# Patient Record
Sex: Female | Born: 1985 | Race: Black or African American | Hispanic: No | Marital: Single | Smoking: Current every day smoker
Health system: Southern US, Community
[De-identification: ages and names within clinical notes are randomized; demographics above are authoritative.]

## PROBLEM LIST (undated history)

## (undated) DIAGNOSIS — D649 Anemia, unspecified: Secondary | ICD-10-CM

## (undated) DIAGNOSIS — A6000 Herpesviral infection of urogenital system, unspecified: Secondary | ICD-10-CM

## (undated) DIAGNOSIS — J45909 Unspecified asthma, uncomplicated: Secondary | ICD-10-CM

## (undated) HISTORY — PX: TUBAL LIGATION: SHX77

## (undated) HISTORY — PX: CHOLECYSTECTOMY: SHX55

---
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
## 2005-01-01 ENCOUNTER — Inpatient Hospital Stay (HOSPITAL_COMMUNITY): Admission: AD | Admit: 2005-01-01 | Discharge: 2005-01-01 | Payer: Self-pay | Admitting: *Deleted

## 2005-04-17 ENCOUNTER — Emergency Department: Payer: Self-pay | Admitting: Emergency Medicine

## 2005-10-20 ENCOUNTER — Emergency Department: Payer: Self-pay | Admitting: Emergency Medicine

## 2006-06-20 ENCOUNTER — Emergency Department: Payer: Self-pay | Admitting: Emergency Medicine

## 2006-08-26 ENCOUNTER — Observation Stay: Payer: Self-pay | Admitting: Obstetrics and Gynecology

## 2006-09-02 ENCOUNTER — Ambulatory Visit: Payer: Self-pay | Admitting: Obstetrics and Gynecology

## 2007-12-28 ENCOUNTER — Emergency Department: Payer: Self-pay | Admitting: Emergency Medicine

## 2008-04-29 ENCOUNTER — Emergency Department: Payer: Self-pay | Admitting: Emergency Medicine

## 2008-05-16 ENCOUNTER — Emergency Department: Payer: Self-pay | Admitting: Emergency Medicine

## 2008-05-23 ENCOUNTER — Emergency Department: Payer: Self-pay | Admitting: Emergency Medicine

## 2008-08-01 ENCOUNTER — Emergency Department: Payer: Self-pay | Admitting: Emergency Medicine

## 2009-01-30 ENCOUNTER — Emergency Department: Payer: Self-pay | Admitting: Emergency Medicine

## 2009-09-28 ENCOUNTER — Emergency Department: Payer: Self-pay | Admitting: Emergency Medicine

## 2010-08-11 ENCOUNTER — Emergency Department: Payer: Self-pay | Admitting: Emergency Medicine

## 2011-08-01 ENCOUNTER — Emergency Department: Payer: Self-pay | Admitting: *Deleted

## 2011-08-06 ENCOUNTER — Observation Stay: Payer: Self-pay | Admitting: Surgery

## 2011-08-10 LAB — PATHOLOGY REPORT

## 2011-11-13 ENCOUNTER — Emergency Department: Payer: Self-pay | Admitting: Emergency Medicine

## 2011-11-14 LAB — COMPREHENSIVE METABOLIC PANEL
Albumin: 3.1 g/dL — ABNORMAL LOW (ref 3.4–5.0)
Anion Gap: 11 (ref 7–16)
Bilirubin,Total: 0.2 mg/dL (ref 0.2–1.0)
Chloride: 110 mmol/L — ABNORMAL HIGH (ref 98–107)
Co2: 25 mmol/L (ref 21–32)
Creatinine: 0.7 mg/dL (ref 0.60–1.30)
EGFR (African American): >60
EGFR (Non-African Amer.): >60
Glucose: 106 mg/dL — ABNORMAL HIGH (ref 65–99)
Osmolality: 293 (ref 275–301)
Potassium: 3.8 mmol/L (ref 3.5–5.1)
SGOT(AST): 19 U/L (ref 15–37)
SGPT (ALT): 22 U/L

## 2011-11-14 LAB — CBC
HGB: 8.7 g/dL — ABNORMAL LOW (ref 12.0–16.0)
MCH: 24.8 pg — ABNORMAL LOW (ref 26.0–34.0)
MCHC: 31.3 g/dL — ABNORMAL LOW (ref 32.0–36.0)
MCV: 79 fL — ABNORMAL LOW (ref 80–100)
RBC: 3.5 10*6/uL — ABNORMAL LOW (ref 3.80–5.20)

## 2011-11-14 LAB — LIPASE, BLOOD: Lipase: 99 U/L (ref 73–393)

## 2012-03-05 ENCOUNTER — Emergency Department: Payer: Self-pay | Admitting: *Deleted

## 2013-10-22 ENCOUNTER — Emergency Department: Payer: Self-pay | Admitting: Emergency Medicine

## 2013-10-22 LAB — URINALYSIS, COMPLETE
Bilirubin,UR: NEGATIVE
Blood: NEGATIVE
GLUCOSE, UR: NEGATIVE mg/dL (ref 0–75)
KETONE: NEGATIVE
NITRITE: NEGATIVE
Ph: 6 (ref 4.5–8.0)
Protein: NEGATIVE
RBC,UR: 10 /HPF (ref 0–5)
SPECIFIC GRAVITY: 1.029 (ref 1.003–1.030)
WBC UR: 46 /HPF (ref 0–5)

## 2013-10-22 LAB — WET PREP, GENITAL

## 2013-10-23 LAB — GC/CHLAMYDIA PROBE AMP

## 2013-10-25 LAB — URINE CULTURE

## 2014-08-31 ENCOUNTER — Emergency Department: Payer: Self-pay | Admitting: Internal Medicine

## 2015-01-07 ENCOUNTER — Emergency Department: Payer: Self-pay | Admitting: Emergency Medicine

## 2015-01-07 LAB — URINALYSIS, COMPLETE
Bilirubin,UR: NEGATIVE
Blood: NEGATIVE
GLUCOSE, UR: NEGATIVE mg/dL (ref 0–75)
Ketone: NEGATIVE
LEUKOCYTE ESTERASE: NEGATIVE
NITRITE: NEGATIVE
Ph: 5 (ref 4.5–8.0)
Protein: 30
RBC,UR: 4 /HPF (ref 0–5)
Specific Gravity: 1.031 (ref 1.003–1.030)

## 2015-01-07 LAB — CBC WITH DIFFERENTIAL/PLATELET
Basophil #: 0.1 10*3/uL (ref 0.0–0.1)
Basophil %: 0.6 %
EOS PCT: 2 %
Eosinophil #: 0.2 10*3/uL (ref 0.0–0.7)
HCT: 34.6 % — ABNORMAL LOW (ref 35.0–47.0)
HGB: 10.8 g/dL — ABNORMAL LOW (ref 12.0–16.0)
LYMPHS ABS: 1.3 10*3/uL (ref 1.0–3.6)
LYMPHS PCT: 14.9 %
MCH: 25.9 pg — ABNORMAL LOW (ref 26.0–34.0)
MCHC: 31.3 g/dL — ABNORMAL LOW (ref 32.0–36.0)
MCV: 83 fL (ref 80–100)
MONOS PCT: 8.6 %
Monocyte #: 0.7 x10 3/mm (ref 0.2–0.9)
NEUTROS PCT: 73.9 %
Neutrophil #: 6.4 10*3/uL (ref 1.4–6.5)
PLATELETS: 339 10*3/uL (ref 150–440)
RBC: 4.18 10*6/uL (ref 3.80–5.20)
RDW: 17.4 % — ABNORMAL HIGH (ref 11.5–14.5)
WBC: 8.7 10*3/uL (ref 3.6–11.0)

## 2015-01-07 LAB — COMPREHENSIVE METABOLIC PANEL
AST: 24 U/L
Albumin: 3.6 g/dL
Alkaline Phosphatase: 85 U/L
Anion Gap: 6 — ABNORMAL LOW (ref 7–16)
BUN: 9 mg/dL
Bilirubin,Total: 0.5 mg/dL
CALCIUM: 8.6 mg/dL — AB
CHLORIDE: 106 mmol/L
Co2: 27 mmol/L
Creatinine: 0.72 mg/dL
Glucose: 89 mg/dL
Potassium: 3.7 mmol/L
SGPT (ALT): 19 U/L
Sodium: 139 mmol/L
Total Protein: 6.9 g/dL

## 2015-02-11 ENCOUNTER — Emergency Department
Admission: EM | Admit: 2015-02-11 | Discharge: 2015-02-12 | Disposition: A | Discharge status: Home / Self Care | Payer: Self-pay | Attending: Emergency Medicine | Admitting: Emergency Medicine

## 2015-02-11 ENCOUNTER — Encounter: Payer: Self-pay | Admitting: *Deleted

## 2015-02-11 DIAGNOSIS — K2970 Gastritis, unspecified, without bleeding: Secondary | ICD-10-CM | POA: Insufficient documentation

## 2015-02-11 DIAGNOSIS — R1013 Epigastric pain: Secondary | ICD-10-CM

## 2015-02-11 DIAGNOSIS — Z720 Tobacco use: Secondary | ICD-10-CM | POA: Insufficient documentation

## 2015-02-11 HISTORY — DX: Anemia, unspecified: D64.9

## 2015-02-11 HISTORY — DX: Unspecified asthma, uncomplicated: J45.909

## 2015-02-11 HISTORY — DX: Herpesviral infection of urogenital system, unspecified: A60.00

## 2015-02-11 NOTE — ED Notes (Signed)
Pt was seen here last month for same type symptoms. Pt states she was told she has a virus but she does not agree with that. Pt states she continues to have symptoms at this time. Upper abd pain and spotting of vaginal blood.

## 2015-02-12 ENCOUNTER — Encounter: Payer: Self-pay | Admitting: Occupational Medicine

## 2015-02-12 LAB — CBC WITH DIFFERENTIAL/PLATELET
Basophils Absolute: 0 10*3/uL (ref 0–0.1)
Eosinophils Absolute: 0.2 10*3/uL (ref 0–0.7)
Eosinophils Relative: 2 %
HEMATOCRIT: 36 % (ref 35.0–47.0)
HEMOGLOBIN: 11.3 g/dL — AB (ref 12.0–16.0)
LYMPHS ABS: 1.5 10*3/uL (ref 1.0–3.6)
MCH: 26.4 pg (ref 26.0–34.0)
MCHC: 31.3 g/dL — ABNORMAL LOW (ref 32.0–36.0)
MCV: 84.3 fL (ref 80.0–100.0)
MONO ABS: 0.7 10*3/uL (ref 0.2–0.9)
Monocytes Relative: 8 %
NEUTROS ABS: 6.6 10*3/uL — AB (ref 1.4–6.5)
Neutrophils Relative %: 72 %
Platelets: 328 10*3/uL (ref 150–440)
RBC: 4.27 MIL/uL (ref 3.80–5.20)
RDW: 18.3 % — AB (ref 11.5–14.5)
WBC: 9 10*3/uL (ref 3.6–11.0)

## 2015-02-12 LAB — URINALYSIS COMPLETE WITH MICROSCOPIC (ARMC ONLY)
BACTERIA UA: NONE SEEN
Bilirubin Urine: NEGATIVE
Glucose, UA: NEGATIVE mg/dL
HGB URINE DIPSTICK: NEGATIVE
KETONES UR: NEGATIVE mg/dL
NITRITE: NEGATIVE
Protein, ur: NEGATIVE mg/dL
Specific Gravity, Urine: 1.004 — ABNORMAL LOW (ref 1.005–1.030)
pH: 6 (ref 5.0–8.0)

## 2015-02-12 LAB — BASIC METABOLIC PANEL
ANION GAP: 7 (ref 5–15)
BUN: 8 mg/dL (ref 6–20)
CALCIUM: 8.6 mg/dL — AB (ref 8.9–10.3)
CHLORIDE: 106 mmol/L (ref 101–111)
CO2: 25 mmol/L (ref 22–32)
Creatinine, Ser: 0.64 mg/dL (ref 0.44–1.00)
GFR calc Af Amer: >60 mL/min (ref >60)
GFR calc non Af Amer: >60 mL/min (ref >60)
GLUCOSE: 105 mg/dL — AB (ref 65–99)
POTASSIUM: 3.4 mmol/L — AB (ref 3.5–5.1)
Sodium: 138 mmol/L (ref 135–145)

## 2015-02-12 MED ORDER — TRAMADOL HCL 50 MG PO TABS: 50.0000 mg | ORAL_TABLET | Freq: Four times a day (QID) | ORAL | Status: AC | PRN

## 2015-02-12 MED ORDER — PANTOPRAZOLE SODIUM 40 MG PO TBEC
DELAYED_RELEASE_TABLET | ORAL | Status: AC
  Administered 2015-02-12: 40 mg via ORAL
  Filled 2015-02-12: qty 1

## 2015-02-12 MED ORDER — GI COCKTAIL ~~LOC~~
ORAL | Status: AC
  Administered 2015-02-12: 30 mL via ORAL
  Filled 2015-02-12: qty 30

## 2015-02-12 MED ORDER — GI COCKTAIL ~~LOC~~
30.0000 mL | Freq: Once | ORAL | Status: AC
  Administered 2015-02-12: 30 mL via ORAL

## 2015-02-12 MED ORDER — PANTOPRAZOLE SODIUM 20 MG PO TBEC
20.0000 mg | DELAYED_RELEASE_TABLET | Freq: Once | ORAL | Status: DC
  Filled 2015-02-12: qty 1

## 2015-02-12 MED ORDER — PANTOPRAZOLE SODIUM 40 MG PO TBEC: 40.0000 mg | DELAYED_RELEASE_TABLET | Freq: Every day | ORAL | Status: DC

## 2015-02-12 NOTE — Discharge Instructions (Signed)

## 2015-02-12 NOTE — ED Provider Notes (Signed)
[facility] Emergency Department Provider Note  Time seen: 12:56 AM  I have reviewed the triage vital signs and the nursing notes.   HISTORY  Chief Complaint Nausea; Emesis; and Abdominal Pain    HPI Yvette Dyer is a 29 y.o. female presents with upper abdominal pain and nausea. Patient states her symptoms have been waxing and waning for approximately 1 month. Patient seen last month for the same complaint with a normal workup of the patient. Patient states occasional alcohol use and symptoms tend to worsen when drinking alcohol. Patient had her gallbladder out approximately 4-5 years ago. States nausea and vomiting but denies any bloody or black vomit. Denies diarrhea/black or bloody stool. Denies fever. Patient describes the pain as a burning sensation in her epigastrium 10 out of 10 at max severity currently 8 out of 10.     Past Medical History  Diagnosis Date  . Asthma   . Anemia   . Genital herpes     There are no active problems to display for this patient.   No past surgical history on file.  Current Outpatient Rx  Name  Route  Sig  Dispense  Refill  . ferrous fumarate (HEMOCYTE - 106 MG FE) 325 (106 FE) MG TABS tablet   Oral   Take 1 tablet by mouth.           Allergies Review of patient's allergies indicates no known allergies.  No family history on file.  Social History History  Substance Use Topics  . Smoking status: Current Every Day Smoker  . Smokeless tobacco: Not on file  . Alcohol Use: Yes    Review of Systems Constitutional: Negative for fever. Cardiovascular: Negative for chest pain. Respiratory: Negative for shortness of breath. Gastrointestinal: Positive for abdominal pain, nausea and vomiting. Genitourinary: Negative for dysuria.  10-point ROS otherwise negative.  ____________________________________________   PHYSICAL EXAM:  VITAL SIGNS: ED Triage Vitals  Enc Vitals Group     BP 02/11/15 2252  146/90 mmHg     Pulse Rate 02/11/15 2252 93     Resp 02/11/15 2252 16     Temp 02/11/15 2252 98.8 F (37.1 C)     Temp Source 02/11/15 2252 Oral     SpO2 02/11/15 2252 100 %     Weight 02/11/15 2252 230 lb (104.327 kg)     Height 02/11/15 2252 5\' 2"  (1.575 m)     Head Cir --      Peak Flow --      Pain Score 02/11/15 2254 0     Pain Loc --      Pain Edu? --      Excl. in [place]? --     Constitutional: Alert and oriented. Well appearing and in no distress. Eyes: Normal exam ENT   Mouth/Throat: Mucous membranes are moist. Cardiovascular: Normal rate, regular rhythm Respiratory: Normal respiratory effort without tachypnea nor retractions. Breath sounds are clear  Gastrointestinal: Moderate tenderness to the epigastrium. No rebound or guarding. No distention. Musculoskeletal: Nontender with normal range of motion  Neurologic:  Normal speech and language. No gross focal neurologic deficits  Skin:  Skin is warm, dry and intact.  Psychiatric: Mood and affect are normal. Speech and behavior are normal.  ____________________________________________     INITIAL IMPRESSION / ASSESSMENT AND PLAN / ED COURSE  Pertinent labs & imaging results that were available during my care of the patient were reviewed by me and considered in my medical decision making (see  chart for details).  29 year old female with epigastric pain times one month and occasional nausea and vomiting. We'll check labs to evaluate. States the pain is worse with alcohol but denies daily drinking. History most consistent with gastritis.  ----------------------------------------- 1:45 AM on 02/12/2015 -----------------------------------------  Labs largely within normal limits. I discussed with patient likely gastritis need to follow up with GI medicine. Patient agreeable to plan we'll discharge with Dr. Vira Agar will place the patient on Protonix and Ultram. Patient agreeable to  plan.  ____________________________________________   FINAL CLINICAL IMPRESSION(S) / ED DIAGNOSES  Epigastric pain. Gastritis   Harvest Dark, MD 02/12/15 [phone]

## 2015-05-09 ENCOUNTER — Emergency Department
Admission: EM | Admit: 2015-05-09 | Discharge: 2015-05-09 | Disposition: A | Discharge status: Home / Self Care | Payer: Self-pay | Attending: Emergency Medicine | Admitting: Emergency Medicine

## 2015-05-09 DIAGNOSIS — Z720 Tobacco use: Secondary | ICD-10-CM | POA: Insufficient documentation

## 2015-05-09 DIAGNOSIS — N342 Other urethritis: Secondary | ICD-10-CM | POA: Insufficient documentation

## 2015-05-09 DIAGNOSIS — Z3202 Encounter for pregnancy test, result negative: Secondary | ICD-10-CM | POA: Insufficient documentation

## 2015-05-09 DIAGNOSIS — Z79899 Other long term (current) drug therapy: Secondary | ICD-10-CM | POA: Insufficient documentation

## 2015-05-09 LAB — URINALYSIS COMPLETE WITH MICROSCOPIC (ARMC ONLY)
BILIRUBIN URINE: NEGATIVE
GLUCOSE, UA: NEGATIVE mg/dL
Ketones, ur: NEGATIVE mg/dL
NITRITE: NEGATIVE
PROTEIN: NEGATIVE mg/dL
SPECIFIC GRAVITY, URINE: 1.011 (ref 1.005–1.030)
pH: 7 (ref 5.0–8.0)

## 2015-05-09 LAB — POCT PREGNANCY, URINE: Preg Test, Ur: NEGATIVE

## 2015-05-09 MED ORDER — HYDROCODONE-ACETAMINOPHEN 5-325 MG PO TABS
1.0000 | ORAL_TABLET | ORAL | Status: DC | PRN
Start: 2015-05-09 — End: 2015-06-15

## 2015-05-09 MED ORDER — SULFAMETHOXAZOLE-TRIMETHOPRIM 800-160 MG PO TABS: 1.0000 | ORAL_TABLET | Freq: Two times a day (BID) | ORAL | Status: DC

## 2015-05-09 MED ORDER — PHENAZOPYRIDINE HCL 200 MG PO TABS: 200.0000 mg | ORAL_TABLET | Freq: Three times a day (TID) | ORAL | Status: DC | PRN

## 2015-05-09 NOTE — ED Provider Notes (Signed)
[facility] Emergency Department Provider Note  ____________________________________________  Time seen: Approximately 3:00 PM  I have reviewed the triage vital signs and the nursing notes.   HISTORY  Chief Complaint Hematuria    HPI Yvette Dyer is a 29 y.o. female who presents for evaluation of dysuria and bleeding upon urination. Patient states symptoms started about 4 hours ago and continues on. Denies any fever chills, last menses was approximately 3 weeks ago.   Past Medical History  Diagnosis Date  . Asthma   . Anemia   . Genital herpes     There are no active problems to display for this patient.   Past Surgical History  Procedure Laterality Date  . Cholecystectomy      Current Outpatient Rx  Name  Route  Sig  Dispense  Refill  . ferrous fumarate (HEMOCYTE - 106 MG FE) 325 (106 FE) MG TABS tablet   Oral   Take 1 tablet by mouth.         Marland Kitchen HYDROcodone-acetaminophen (NORCO) 5-325 MG per tablet   Oral   Take 1-2 tablets by mouth every 4 (four) hours as needed for moderate pain.   10 tablet   0   . pantoprazole (PROTONIX) 40 MG tablet   Oral   Take 1 tablet (40 mg total) by mouth daily.   30 tablet   1   . phenazopyridine (PYRIDIUM) 200 MG tablet   Oral   Take 1 tablet (200 mg total) by mouth 3 (three) times daily as needed for pain.   6 tablet   0   . sulfamethoxazole-trimethoprim (BACTRIM DS,SEPTRA DS) 800-160 MG per tablet   Oral   Take 1 tablet by mouth 2 (two) times daily.   20 tablet   0   . traMADol (ULTRAM) 50 MG tablet   Oral   Take 1 tablet (50 mg total) by mouth every 6 (six) hours as needed.   20 tablet   0     Allergies Review of patient's allergies indicates no known allergies.  No family history on file.  Social History History  Substance Use Topics  . Smoking status: Current Every Day Smoker -- 4.00 packs/day for 10 years    Types: Cigarettes  . Smokeless tobacco: Not on file  .  Alcohol Use: Yes    Review of Systems Constitutional: No fever/chills Eyes: No visual changes. ENT: No sore throat. Cardiovascular: Denies chest pain. Respiratory: Denies shortness of breath. Gastrointestinal: No abdominal pain.  No nausea, no vomiting.  No diarrhea.  No constipation. Genitourinary: Positive for dysuria and hematuria. Musculoskeletal: Negative for back pain. Skin: Negative for rash. Neurological: Negative for headaches, focal weakness or numbness.  10-point ROS otherwise negative.  ____________________________________________   PHYSICAL EXAM:  VITAL SIGNS: ED Triage Vitals  Enc Vitals Group     BP 05/09/15 1355 125/73 mmHg     Pulse Rate 05/09/15 1355 81     Resp 05/09/15 1355 18     Temp 05/09/15 1355 98.8 F (37.1 C)     Temp Source 05/09/15 1355 Oral     SpO2 05/09/15 1355 98 %     Weight 05/09/15 1355 230 lb (104.327 kg)     Height 05/09/15 1355 5\' 2"  (1.575 m)     Head Cir --      Peak Flow --      Pain Score 05/09/15 1355 6     Pain Loc --      Pain Edu? --  Excl. in [place]? --     Constitutional: Alert and oriented. Well appearing and in no acute distress. Neck: No stridor.   Cardiovascular: Normal rate, regular rhythm. Grossly normal heart sounds.  Good peripheral circulation. Respiratory: Normal respiratory effort.  No retractions. Lungs CTAB. Gastrointestinal: Soft and nontender. No distention. No abdominal bruits. No CVA tenderness. Musculoskeletal: No lower extremity tenderness nor edema.  No joint effusions. Neurologic:  Normal speech and language. No gross focal neurologic deficits are appreciated. No gait instability. Skin:  Skin is warm, dry and intact. No rash noted. Psychiatric: Mood and affect are normal. Speech and behavior are normal.  ____________________________________________   LABS (all labs ordered are listed, but only abnormal results are displayed)  Labs Reviewed  URINALYSIS COMPLETEWITH MICROSCOPIC ([place] ONLY) -  Abnormal; Notable for the following:    Color, Urine YELLOW (*)    APPearance CLOUDY (*)    Hgb urine dipstick 1+ (*)    Leukocytes, UA 1+ (*)    Bacteria, UA RARE (*)    Squamous Epithelial / LPF TOO NUMEROUS TO COUNT (*)    All other components within normal limits  POC URINE PREG, ED  POCT PREGNANCY, URINE     PROCEDURES  Procedure(s) performed: None  Critical Care performed: No  ____________________________________________   INITIAL IMPRESSION / ASSESSMENT AND PLAN / ED COURSE  Pertinent labs & imaging results that were available during my care of the patient were reviewed by me and considered in my medical decision making (see chart for details).  Acute UTI. Rx given for Bactrim DS twice a day and Pyridium 200 mg 3 times a day. Patient encouraged plenty of fluids. She is to return to the ER with any worsening symptomology. Patient voices no other emergency medical complaints at this time. ____________________________________________   FINAL CLINICAL IMPRESSION(S) / ED DIAGNOSES  Final diagnoses:  Infective urethritis      Arlyss Repress, PA-C 05/09/15 [place], MD 05/09/15 2325

## 2015-05-09 NOTE — ED Notes (Signed)
2 cups of water given to patient. Unable to urinate at this time.

## 2015-05-09 NOTE — ED Notes (Signed)
Pt states that she voided at lunch time and had severe pain with bleeding, pt states that she never has felt like this before

## 2015-05-09 NOTE — ED Notes (Signed)
Pt reports going to the bathroom and had pain with urination and pink blood also noted.  Not currently menstruating but states she is due at the end of the month.

## 2015-05-09 NOTE — Discharge Instructions (Signed)

## 2015-06-14 ENCOUNTER — Encounter: Payer: Self-pay | Admitting: Emergency Medicine

## 2015-06-14 DIAGNOSIS — Z3202 Encounter for pregnancy test, result negative: Secondary | ICD-10-CM | POA: Insufficient documentation

## 2015-06-14 DIAGNOSIS — R51 Headache: Secondary | ICD-10-CM | POA: Insufficient documentation

## 2015-06-14 DIAGNOSIS — R21 Rash and other nonspecific skin eruption: Secondary | ICD-10-CM | POA: Insufficient documentation

## 2015-06-14 DIAGNOSIS — Z720 Tobacco use: Secondary | ICD-10-CM | POA: Insufficient documentation

## 2015-06-14 DIAGNOSIS — R110 Nausea: Secondary | ICD-10-CM | POA: Insufficient documentation

## 2015-06-14 NOTE — ED Notes (Signed)
Pt c/o dysuria intermittently since being treated for UTI 3 weeks ago; headache for 1 hour; vomited once when HA started; in no acute distress

## 2015-06-15 ENCOUNTER — Emergency Department
Admission: EM | Admit: 2015-06-15 | Discharge: 2015-06-15 | Disposition: A | Discharge status: Home / Self Care | Payer: Self-pay | Attending: Emergency Medicine | Admitting: Emergency Medicine

## 2015-06-15 DIAGNOSIS — R110 Nausea: Secondary | ICD-10-CM

## 2015-06-15 DIAGNOSIS — R519 Headache, unspecified: Secondary | ICD-10-CM

## 2015-06-15 DIAGNOSIS — R51 Headache: Secondary | ICD-10-CM

## 2015-06-15 LAB — PREGNANCY, URINE: Preg Test, Ur: NEGATIVE

## 2015-06-15 LAB — URINALYSIS COMPLETE WITH MICROSCOPIC (ARMC ONLY)
BACTERIA UA: NONE SEEN
Bilirubin Urine: NEGATIVE
Glucose, UA: NEGATIVE mg/dL
KETONES UR: NEGATIVE mg/dL
LEUKOCYTES UA: NEGATIVE
Nitrite: NEGATIVE
PROTEIN: NEGATIVE mg/dL
Specific Gravity, Urine: 1.004 — ABNORMAL LOW (ref 1.005–1.030)
pH: 7 (ref 5.0–8.0)

## 2015-06-15 MED ORDER — IBUPROFEN 800 MG PO TABS: 800.0000 mg | ORAL_TABLET | Freq: Once | ORAL | Status: DC

## 2015-06-15 MED ORDER — BUTALBITAL-APAP-CAFFEINE 50-325-40 MG PO TABS: 1.0000 | ORAL_TABLET | Freq: Four times a day (QID) | ORAL | Status: AC | PRN

## 2015-06-15 MED ORDER — METOCLOPRAMIDE HCL 10 MG PO TABS
10.0000 mg | ORAL_TABLET | Freq: Once | ORAL | Status: AC
  Administered 2015-06-15: 10 mg via ORAL
  Filled 2015-06-15: qty 1

## 2015-06-15 MED ORDER — BUTALBITAL-APAP-CAFFEINE 50-325-40 MG PO TABS
2.0000 | ORAL_TABLET | Freq: Once | ORAL | Status: AC
  Administered 2015-06-15: 2 via ORAL
  Filled 2015-06-15: qty 2

## 2015-06-15 NOTE — Discharge Instructions (Signed)
General Headache Without Cause A headache is pain or discomfort felt around the head or neck area. The specific cause of a headache may not be found. There are many causes and types of headaches. A few common ones are:  Tension headaches.  Migraine headaches.  Cluster headaches.  Chronic daily headaches. HOME CARE INSTRUCTIONS   Keep all follow-up appointments with your caregiver or any specialist referral.  Only take over-the-counter or prescription medicines for pain or discomfort as directed by your caregiver.  Lie down in a dark, quiet room when you have a headache.  Keep a headache journal to find out what may trigger your migraine headaches. For example, write down:  What you eat and drink.  How much sleep you get.  Any change to your diet or medicines.  Try massage or other relaxation techniques.  Put ice packs or heat on the head and neck. Use these 3 to 4 times per day for 15 to 20 minutes each time, or as needed.  Limit stress.  Sit up straight, and do not tense your muscles.  Quit smoking if you smoke.  Limit alcohol use.  Decrease the amount of caffeine you drink, or stop drinking caffeine.  Eat and sleep on a regular schedule.  Get 7 to 9 hours of sleep, or as recommended by your caregiver.  Keep lights dim if bright lights bother you and make your headaches worse. SEEK MEDICAL CARE IF:   You have problems with the medicines you were prescribed.  Your medicines are not working.  You have a change from the usual headache.  You have nausea or vomiting. SEEK IMMEDIATE MEDICAL CARE IF:   Your headache becomes severe.  You have a fever.  You have a stiff neck.  You have loss of vision.  You have muscular weakness or loss of muscle control.  You start losing your balance or have trouble walking.  You feel faint or pass out.  You have severe symptoms that are different from your first symptoms. MAKE SURE YOU:   Understand these  instructions.  Will watch your condition.  Will get help right away if you are not doing well or get worse. Document Released: 09/27/2005 Document Revised: 12/20/2011 Document Reviewed: 10/13/2011 [facility] Patient Information 2015 [place], [place]. This information is not intended to replace advice given to you by your health care provider. Make sure you discuss any questions you have with your health care provider.  Nausea and Vomiting Nausea means you feel sick to your stomach. Throwing up (vomiting) is a reflex where stomach contents come out of your mouth. HOME CARE   Take medicine as told by your doctor.  Do not force yourself to eat. However, you do need to drink fluids.  If you feel like eating, eat a normal diet as told by your doctor.  Eat rice, wheat, potatoes, bread, lean meats, yogurt, fruits, and vegetables.  Avoid high-fat foods.  Drink enough fluids to keep your pee (urine) clear or pale yellow.  Ask your doctor how to replace body fluid losses (rehydrate). Signs of body fluid loss (dehydration) include:  Feeling very thirsty.  Dry lips and mouth.  Feeling dizzy.  Dark pee.  Peeing less than normal.  Feeling confused.  Fast breathing or heart rate. GET HELP RIGHT AWAY IF:   You have blood in your throw up.  You have black or bloody poop (stool).  You have a bad headache or stiff neck.  You feel confused.  You have bad  belly (abdominal) pain.  You have chest pain or trouble breathing.  You do not pee at least once every 8 hours.  You have cold, clammy skin.  You keep throwing up after 24 to 48 hours.  You have a fever. MAKE SURE YOU:   Understand these instructions.  Will watch your condition.  Will get help right away if you are not doing well or get worse. Document Released: 03/15/2008 Document Revised: 12/20/2011 Document Reviewed: 02/26/2011 [facility] Patient Information 2015 [place], [place]. This information is not intended to  replace advice given to you by your health care provider. Make sure you discuss any questions you have with your health care provider.

## 2015-06-15 NOTE — ED Provider Notes (Signed)
[facility] Emergency Department Provider Note  ____________________________________________  Time seen: Approximately 0039 AM  I have reviewed the triage vital signs and the nursing notes.   HISTORY  Chief Complaint Headache; Rash; Nausea; and Dysuria    HPI Yvette Dyer is a 29 y.o. female who comes into the hospital today for multiple reasons. The patient reports he started to feel nauseous and had an episode of emesis. She reports that after that episode of emesis she developed a headache. She reports that the headache was intense when she arrived but it calmed down without medication. The patient reports that 3 weeks ago she was here and diagnosed with a UTI and also has continued occasional pain when she urinates. She reports that she did not take anything for the headache but because she felt weird she decided to come in. The patient reports that she no longer feels nauseous and has not had any more episodes of emesis. The patient also reports that she's had some rash on her arms and on her abdomen and she was unsure if she maybe had hand-foot-and-mouth and if that was the reason for her symptoms. The patient does not have a primary care physician. She reports that her headache is currently a 5 out of 10 in intensity and has improved significantly. She reports that she does get headaches occasionally and is unsure if this headache is due to stress. She reports that she's had normal menstrual cycles as well and her last period started on Tuesday. The patient was concerned she decided to come in for evaluation.   Past Medical History  Diagnosis Date  . Asthma   . Anemia   . Genital herpes     There are no active problems to display for this patient.   Past Surgical History  Procedure Laterality Date  . Cholecystectomy      Current Outpatient Rx  Name  Route  Sig  Dispense  Refill  . butalbital-acetaminophen-caffeine (FIORICET) 50-325-40 MG per  tablet   Oral   Take 1-2 tablets by mouth every 6 (six) hours as needed for headache.   12 tablet   0   . phenazopyridine (PYRIDIUM) 200 MG tablet   Oral   Take 1 tablet (200 mg total) by mouth 3 (three) times daily as needed for pain. Patient not taking: Reported on 06/15/2015   6 tablet   0   . sulfamethoxazole-trimethoprim (BACTRIM DS,SEPTRA DS) 800-160 MG per tablet   Oral   Take 1 tablet by mouth 2 (two) times daily. Patient not taking: Reported on 06/15/2015   20 tablet   0   . traMADol (ULTRAM) 50 MG tablet   Oral   Take 1 tablet (50 mg total) by mouth every 6 (six) hours as needed. Patient not taking: Reported on 06/15/2015   20 tablet   0     Allergies Review of patient's allergies indicates no known allergies.  History reviewed. No pertinent family history.  Social History Social History  Substance Use Topics  . Smoking status: Current Every Day Smoker -- 10 years    Types: Cigarettes  . Smokeless tobacco: None  . Alcohol Use: Yes    Review of Systems Constitutional: No fever/chills Eyes: No visual changes. ENT: No sore throat. Cardiovascular: Denies chest pain. Respiratory: Denies shortness of breath. Gastrointestinal: Nausea and vomiting with No abdominal pain  No diarrhea.  No constipation. Genitourinary: Negative for dysuria. Musculoskeletal: Negative for back pain. Skin:  rash. Neurological: Headache  10-point ROS otherwise negative.  ____________________________________________   PHYSICAL EXAM:  VITAL SIGNS: ED Triage Vitals  Enc Vitals Group     BP 06/14/15 2312 135/82 mmHg     Pulse Rate 06/14/15 2312 83     Resp 06/14/15 2312 18     Temp 06/14/15 2312 98.4 F (36.9 C)     Temp Source 06/14/15 2312 Oral     SpO2 06/14/15 2312 99 %     Weight 06/14/15 2312 230 lb (104.327 kg)     Height 06/14/15 2312 5\' 2"  (1.575 m)     Head Cir --      Peak Flow --      Pain Score 06/14/15 2313 8     Pain Loc --      Pain Edu? --      Excl. in  [place]? --     Constitutional: Alert and oriented. Well appearing and in mild distress. Eyes: Conjunctivae are normal. PERRL. EOMI. Head: Atraumatic. Nose: No congestion/rhinnorhea. Mouth/Throat: Mucous membranes are moist.  Oropharynx non-erythematous. Cardiovascular: Normal rate, regular rhythm. Grossly normal heart sounds.  Good peripheral circulation. Respiratory: Normal respiratory effort.  No retractions. Lungs CTAB. Gastrointestinal: Soft and nontender. No distention. Positive bowel sounds Musculoskeletal: No lower extremity tenderness nor edema.  Neurologic:  Normal speech and language.  Skin:  Skin is warm, dry and intact. Mild maculopapular rash on patient's right forearm appears to be 2 papules. Psychiatric: Mood and affect are normal.   ____________________________________________   LABS (all labs ordered are listed, but only abnormal results are displayed)  Labs Reviewed  URINALYSIS COMPLETEWITH MICROSCOPIC ([place]) - Abnormal; Notable for the following:    Color, Urine STRAW (*)    APPearance CLEAR (*)    Specific Gravity, Urine 1.004 (*)    Hgb urine dipstick 3+ (*)    Squamous Epithelial / LPF 0-5 (*)    All other components within normal limits  URINE CULTURE  PREGNANCY, URINE   ____________________________________________  EKG  None ____________________________________________  RADIOLOGY  None ____________________________________________   PROCEDURES  Procedure(s) performed: None  Critical Care performed: No  ____________________________________________   INITIAL IMPRESSION / ASSESSMENT AND PLAN / ED COURSE  Pertinent labs & imaging results that were available during my care of the patient were reviewed by me and considered in my medical decision making (see chart for details).  The patient is a 29 year old female who comes in with intermittent pain with urination, headache and nausea and vomiting. The patient reports the symptoms are  improving and she did not take anything from home. The patient does not have any bacteria or white blood cells in her urine as demonstrated by the urinalysis. The patient may have had some nausea due to her headache and she has been diagnosed with gastritis in the past. I gave the patient a Fioricet as well as some Reglan and she reports her symptoms are improving. I will have the [facility]. If the symptoms persist the patient returns to emergency department for further evaluation. I will also send a urine culture to determine if the patient has any bacteria growing out of her urine. ____________________________________________   FINAL CLINICAL IMPRESSION(S) / ED DIAGNOSES  Final diagnoses:  Acute nonintractable headache, unspecified headache type  Nausea      Loney Hering, MD 06/15/15 [phone]

## 2015-06-16 LAB — URINE CULTURE: Special Requests: NORMAL

## 2015-11-20 ENCOUNTER — Ambulatory Visit
Admission: EM | Admit: 2015-11-20 | Discharge: 2015-11-20 | Disposition: A | Discharge status: Home / Self Care | Payer: PRIVATE HEALTH INSURANCE | Attending: Family Medicine | Admitting: Family Medicine

## 2015-11-20 DIAGNOSIS — L02215 Cutaneous abscess of perineum: Secondary | ICD-10-CM | POA: Diagnosis not present

## 2015-11-20 MED ORDER — TRAMADOL HCL 50 MG PO TABS: 50.0000 mg | ORAL_TABLET | Freq: Three times a day (TID) | ORAL | Status: DC | PRN

## 2015-11-20 MED ORDER — MUPIROCIN 2 % EX OINT: 1.0000 "application " | TOPICAL_OINTMENT | Freq: Three times a day (TID) | CUTANEOUS | Status: DC

## 2015-11-20 MED ORDER — SULFAMETHOXAZOLE-TRIMETHOPRIM 800-160 MG PO TABS: 1.0000 | ORAL_TABLET | Freq: Two times a day (BID) | ORAL | Status: DC

## 2015-11-20 NOTE — Discharge Instructions (Signed)
Abscess °An abscess (boil or furuncle) is an infected area on or under the skin. This area is filled with yellowish-white fluid (pus) and other material (debris). °HOME CARE  °· Only take medicines as told by your doctor. °· If you were given antibiotic medicine, take it as directed. Finish the medicine even if you start to feel better. °· If gauze is used, follow your doctor's directions for changing the gauze. °· To avoid spreading the infection: °¨ Keep your abscess covered with a bandage. °¨ Wash your hands well. °¨ Do not share personal care items, towels, or whirlpools with others. °¨ Avoid skin contact with others. °· Keep your skin and clothes clean around the abscess. °· Keep all doctor visits as told. °GET HELP RIGHT AWAY IF:  °· You have more pain, puffiness (swelling), or redness in the wound site. °· You have more fluid or blood coming from the wound site. °· You have muscle aches, chills, or you feel sick. °· You have a fever. °MAKE SURE YOU:  °· Understand these instructions. °· Will watch your condition. °· Will get help right away if you are not doing well or get worse. °  °This information is not intended to replace advice given to you by your health care provider. Make sure you discuss any questions you have with your health care provider. °  °Document Released: 03/15/2008 Document Revised: 03/28/2012 Document Reviewed: 12/11/2011 °[employer]vier Interactive Patient Education ©2016 [employer] ° °

## 2015-11-20 NOTE — ED Provider Notes (Signed)
CSN: [id number]     Arrival date & time 11/20/15  1038 History   First MD Initiated Contact with Patient 11/20/15 1122    Nurses notes were reviewed. Chief Complaint  Patient presents with  . Abscess   Patient's here because of abscess. She states off through 4 days ago she started noticing swelling in the left labial area. She states that she has had herpes before but this is not the same pain is herpes. When she's had abscesses or hair follicle ingrown in that area use he will open on the only drain. This time it did not and she's had more pain. She denies any vaginal discharge and of course she started her period as mentioned today. She states it hurts when she sits hurts when she walks and today after her menstrual cycle started today she figure that that was the final straw x-rays and get fixed at least in that area today. She denies any fever no nausea vomiting no history of any recent abscesses. Patient does smoke. She's had cholecystectomy and C-section history of asthma and anemia mother has had asthma and sarcoidosis as well.   (Consider location/radiation/quality/duration/timing/severity/associated sxs/prior Treatment) Patient is a 30 y.o. female presenting with abscess. The history is provided by the patient. No language interpreter was used.  Abscess Location:  Ano-genital Ano-genital abscess location:  Pelvis (Left lower labial/perineal abscess) Abscess quality: induration   Red streaking: no   Duration:  4 days Progression:  Worsening Chronicity:  New Context: not diabetes, not immunosuppression, not injected drug use and not insect bite/sting   Relieved by:  Nothing Worsened by:  Topical antibiotics Ineffective treatments:  Topical antibiotics Associated symptoms: no fever   Risk factors: prior abscess   Risk factors: no family hx of MRSA and no hx of MRSA     Past Medical History  Diagnosis Date  . Asthma   . Anemia   . Genital herpes    Past Surgical History    Procedure Laterality Date  . Cholecystectomy    . Cesarean section      2006 and 2008   Family History  Problem Relation Age of Onset  . Asthma Mother   . Sarcoidosis Mother    Social History  Substance Use Topics  . Smoking status: Current Every Day Smoker -- 10 years    Types: Cigarettes  . Smokeless tobacco: None  . Alcohol Use: Yes     Comment: socially   OB History    No data available     Review of Systems  Constitutional: Negative for fever.  All other systems reviewed and are negative.   Allergies  Review of patient's allergies indicates no known allergies.  Home Medications   Prior to Admission medications   Medication Sig Start Date End Date Taking? Authorizing Provider  butalbital-acetaminophen-caffeine (FIORICET) 50-325-40 MG per tablet Take 1-2 tablets by mouth every 6 (six) hours as needed for headache. 06/15/15 06/14/16  Loney Hering, MD  mupirocin ointment (BACTROBAN) 2 % Apply 1 application topically 3 (three) times daily. 11/20/15   Frederich Cha, MD  phenazopyridine (PYRIDIUM) 200 MG tablet Take 1 tablet (200 mg total) by mouth 3 (three) times daily as needed for pain. Patient not taking: Reported on 06/15/2015 05/09/15   Pierce Crane Beers, PA-C  sulfamethoxazole-trimethoprim (BACTRIM DS,SEPTRA DS) 800-160 MG per tablet Take 1 tablet by mouth 2 (two) times daily. Patient not taking: Reported on 06/15/2015 05/09/15   Pierce Crane Beers, PA-C  sulfamethoxazole-trimethoprim (BACTRIM DS,SEPTRA DS)  800-160 MG tablet Take 1 tablet by mouth 2 (two) times daily. 11/20/15   Frederich Cha, MD  traMADol (ULTRAM) 50 MG tablet Take 1 tablet (50 mg total) by mouth every 6 (six) hours as needed. Patient not taking: Reported on 06/15/2015 02/12/15 02/12/16  Harvest Dark, MD  traMADol (ULTRAM) 50 MG tablet Take 1 tablet (50 mg total) by mouth 3 (three) times daily as needed. 11/20/15   Frederich Cha, MD   Meds Ordered and Administered this Visit  Medications - No data to display  BP  138/93 mmHg  Pulse 88  Temp(Src) 96.6 F (35.9 C) (Tympanic)  Resp 17  Ht 5\' 2"  (1.575 m)  Wt 226 lb (102.513 kg)  BMI 41.33 kg/m2  SpO2 100%  LMP 11/20/2015 (Exact Date) No data found.   Physical Exam  Constitutional: She is oriented to person, place, and time. She appears well-developed and well-nourished.  HENT:  Head: Normocephalic and atraumatic.  Eyes: Pupils are equal, round, and reactive to light.  Genitourinary:    There is lesion on the left labia.  She has what looks like a left labial asked abscess this just right below the lower left vaginal lip. It area is slightly warm raised but not fluctuant enough to open up at this time  Musculoskeletal: Normal range of motion.  Neurological: She is alert and oriented to person, place, and time.  Skin: Skin is warm and dry.  Psychiatric: She has a normal mood and affect.    ED Course  Procedures (including critical care time)  Labs Review Labs Reviewed - No data to display  Imaging Review No results found.   Visual Acuity Review  Right Eye Distance:   Left Eye Distance:   Bilateral Distance:    Right Eye Near:   Left Eye Near:    Bilateral Near:         MDM   1. Cutaneous abscess of perineum    Explain to her that we'll get this about 2448 hrs. this could get worse if it does get worse she can come in Saturday to see either Mr. Lynwood Dawley per Dr. Cornelius Moras for I&D. We'll place on tramadol for pain Septra DS twice a day and Bactroban ointment to 3 times a day to use on when necessary basis. Also gave her note for today and tomorrow.    Frederich Cha, MD 11/20/15 [id number]

## 2015-11-20 NOTE — ED Notes (Signed)
States had a "boil" left outer labia x 2 days

## 2017-04-19 ENCOUNTER — Encounter: Payer: Self-pay | Admitting: Emergency Medicine

## 2017-04-19 ENCOUNTER — Emergency Department: Payer: BLUE CROSS/BLUE SHIELD

## 2017-04-19 DIAGNOSIS — F17210 Nicotine dependence, cigarettes, uncomplicated: Secondary | ICD-10-CM | POA: Diagnosis not present

## 2017-04-19 DIAGNOSIS — Z79899 Other long term (current) drug therapy: Secondary | ICD-10-CM | POA: Diagnosis not present

## 2017-04-19 DIAGNOSIS — R0789 Other chest pain: Secondary | ICD-10-CM | POA: Diagnosis present

## 2017-04-19 DIAGNOSIS — M25512 Pain in left shoulder: Secondary | ICD-10-CM | POA: Insufficient documentation

## 2017-04-19 DIAGNOSIS — J45909 Unspecified asthma, uncomplicated: Secondary | ICD-10-CM | POA: Diagnosis not present

## 2017-04-19 LAB — BASIC METABOLIC PANEL
Anion gap: 7 (ref 5–15)
BUN: 8 mg/dL (ref 6–20)
CALCIUM: 8.6 mg/dL — AB (ref 8.9–10.3)
CO2: 26 mmol/L (ref 22–32)
CREATININE: 0.75 mg/dL (ref 0.44–1.00)
Chloride: 109 mmol/L (ref 101–111)
GFR calc Af Amer: >60 mL/min (ref >60)
GFR calc non Af Amer: >60 mL/min (ref >60)
GLUCOSE: 96 mg/dL (ref 65–99)
Potassium: 3.5 mmol/L (ref 3.5–5.1)
Sodium: 142 mmol/L (ref 135–145)

## 2017-04-19 LAB — CBC
HCT: 33.6 % — ABNORMAL LOW (ref 35.0–47.0)
Hemoglobin: 10.8 g/dL — ABNORMAL LOW (ref 12.0–16.0)
MCH: 26.4 pg (ref 26.0–34.0)
MCHC: 32.2 g/dL (ref 32.0–36.0)
MCV: 82.1 fL (ref 80.0–100.0)
PLATELETS: 366 10*3/uL (ref 150–440)
RBC: 4.09 MIL/uL (ref 3.80–5.20)
RDW: 19.5 % — AB (ref 11.5–14.5)
WBC: 8 10*3/uL (ref 3.6–11.0)

## 2017-04-19 LAB — TROPONIN I

## 2017-04-19 NOTE — ED Triage Notes (Signed)
Pt's left shoulder started hurting on yesterday and is radiating to back. She states she has a bug bite on her back that she noticed a few days ago and her left side aching.

## 2017-04-20 ENCOUNTER — Emergency Department
Admission: EM | Admit: 2017-04-20 | Discharge: 2017-04-20 | Disposition: A | Discharge status: Home / Self Care | Payer: BLUE CROSS/BLUE SHIELD | Attending: Emergency Medicine | Admitting: Emergency Medicine

## 2017-04-20 DIAGNOSIS — R0789 Other chest pain: Secondary | ICD-10-CM

## 2017-04-20 NOTE — Discharge Instructions (Signed)
Please seek medical attention for any high fevers, chest pain, shortness of breath, change in behavior, persistent vomiting, bloody stool or any other new or concerning symptoms.  

## 2017-04-20 NOTE — ED Provider Notes (Signed)
[facility] Emergency Department Provider Note   ____________________________________________   I have reviewed the triage vital signs and the nursing notes.   HISTORY  Chief Complaint Back Pain and Chest Pain   History limited by: Not Limited   HPI Yvette Dyer is a 31 y.o. female who presents to the emergency department today with primary concern for left chest pain. Says been going on for the past couple of days. It is located in the left center and left shoulder. It is worse with movement of the left arm. The patient has not had any worsening shortness of breath over her chronic issues with asthma. She denies any unusual activity or lifting with that arm. In addition the patient states that she noticed she had a bug late on her back.    Past Medical History:  Diagnosis Date  . Anemia   . Asthma   . Genital herpes     There are no active problems to display for this patient.   Past Surgical History:  Procedure Laterality Date  . CESAREAN SECTION     2006 and 2008  . CHOLECYSTECTOMY      Prior to Admission medications   Medication Sig Start Date End Date Taking? Authorizing Provider  mupirocin ointment (BACTROBAN) 2 % Apply 1 application topically 3 (three) times daily. 11/20/15   Frederich Cha, MD  phenazopyridine (PYRIDIUM) 200 MG tablet Take 1 tablet (200 mg total) by mouth 3 (three) times daily as needed for pain. Patient not taking: Reported on 06/15/2015 05/09/15   Arlyss Repress, PA-C  sulfamethoxazole-trimethoprim (BACTRIM DS,SEPTRA DS) 800-160 MG per tablet Take 1 tablet by mouth 2 (two) times daily. Patient not taking: Reported on 06/15/2015 05/09/15   Arlyss Repress, PA-C  sulfamethoxazole-trimethoprim (BACTRIM DS,SEPTRA DS) 800-160 MG tablet Take 1 tablet by mouth 2 (two) times daily. 11/20/15   Frederich Cha, MD  traMADol (ULTRAM) 50 MG tablet Take 1 tablet (50 mg total) by mouth 3 (three) times daily as needed. 11/20/15   Frederich Cha,  MD    Allergies Patient has no known allergies.  Family History  Problem Relation Age of Onset  . Asthma Mother   . Sarcoidosis Mother     Social History Social History  Substance Use Topics  . Smoking status: Current Every Day Smoker    Years: 10.00    Types: Cigarettes  . Smokeless tobacco: Not on file  . Alcohol use Yes     Comment: socially    Review of Systems Constitutional: No fever/chills Eyes: No visual changes. ENT: No sore throat. Cardiovascular: Positive for left chest pain Respiratory: Denies shortness of breath. Gastrointestinal: No abdominal pain.  Genitourinary: Negative for dysuria. Musculoskeletal: Positive for left shoulder pain Skin: Positive for bug bite Neurological: Negative for headaches, focal weakness or numbness.  ____________________________________________   PHYSICAL EXAM:  VITAL SIGNS: ED Triage Vitals  Enc Vitals Group     BP 04/19/17 2230 129/70     Pulse Rate 04/19/17 2230 84     Resp 04/19/17 2230 18     Temp 04/19/17 2230 98.1 F (36.7 C)     Temp Source 04/19/17 2230 Oral     SpO2 04/19/17 2230 100 %     Weight 04/19/17 2231 243 lb (110.2 kg)     Height 04/19/17 2231 5\' 2"  (1.575 m)     Head Circumference --      Peak Flow --      Pain Score 04/19/17 2229 6  Constitutional: Alert and oriented. Well appearing and in no distress. Eyes: Conjunctivae are normal.  ENT   Head: Normocephalic and atraumatic.   Nose: No congestion/rhinnorhea.   Mouth/Throat: Mucous membranes are moist.   Neck: No stridor. Hematological/Lymphatic/Immunilogical: No cervical lymphadenopathy. Cardiovascular: Normal rate, regular rhythm.  No murmurs, rubs, or gallops.  Respiratory: Normal respiratory effort without tachypnea nor retractions. Breath sounds are clear and equal bilaterally. No wheezes/rales/rhonchi. Gastrointestinal: Soft and non tender. No rebound. No guarding.  Genitourinary: Deferred Musculoskeletal: Normal range  of motion in all extremities. No lower extremity edema. Neurologic:  Normal speech and language. No gross focal neurologic deficits are appreciated.  Skin:  Skin is warm, dry and intact. No rash noted. Psychiatric: Mood and affect are normal. Speech and behavior are normal. Patient exhibits appropriate insight and judgment.  ____________________________________________    LABS (pertinent positives/negatives)  Labs Reviewed  BASIC METABOLIC PANEL - Abnormal; Notable for the following:       Result Value   Calcium 8.6 (*)    All other components within normal limits  CBC - Abnormal; Notable for the following:    Hemoglobin 10.8 (*)    HCT 33.6 (*)    RDW 19.5 (*)    All other components within normal limits  TROPONIN I     ____________________________________________   EKG  I, Nance Pear, attending physician, personally viewed and interpreted this EKG  EKG Time: 2243 Rate: 70 Rhythm: normal sinus rhythm Axis: normal Intervals: qtc 414 QRS: narrow ST changes: no st elevation Impression: possible left atrial enlargement  ____________________________________________    RADIOLOGY  CXR IMPRESSION:  No acute pulmonary process.      ____________________________________________   PROCEDURES  Procedures  ____________________________________________   INITIAL IMPRESSION / ASSESSMENT AND PLAN / ED COURSE  Pertinent labs & imaging results that were available during my care of the patient were reviewed by me and considered in my medical decision making (see chart for details).  Patient presented to the emergency department today with concerns for left shoulder and chest pain. It is worse with movement. At this point I think muscle skeletal source of the pain likely. The patient had negative chest x-ray, blood work without concerning findings and EKG without any acute findings. Patient has known anemia. States her blood level today is good for her.    ____________________________________________   FINAL CLINICAL IMPRESSION(S) / ED DIAGNOSES  Final diagnoses:  Chest wall pain     Note: This dictation was prepared with Dragon dictation. Any transcriptional errors that result from this process are unintentional     Nance Pear, MD 04/20/17 [phone]

## 2017-12-01 ENCOUNTER — Other Ambulatory Visit: Payer: Self-pay | Admitting: Family Medicine

## 2017-12-01 DIAGNOSIS — D249 Benign neoplasm of unspecified breast: Secondary | ICD-10-CM

## 2017-12-12 ENCOUNTER — Ambulatory Visit
Admission: RE | Admit: 2017-12-12 | Discharge: 2017-12-12 | Disposition: A | Discharge status: Home / Self Care | Payer: BLUE CROSS/BLUE SHIELD | Source: Ambulatory Visit | Attending: Family Medicine | Admitting: Family Medicine

## 2017-12-12 DIAGNOSIS — D249 Benign neoplasm of unspecified breast: Secondary | ICD-10-CM

## 2017-12-12 DIAGNOSIS — D241 Benign neoplasm of right breast: Secondary | ICD-10-CM | POA: Diagnosis not present

## 2017-12-21 ENCOUNTER — Other Ambulatory Visit: Payer: Self-pay | Admitting: Family Medicine

## 2017-12-21 DIAGNOSIS — D249 Benign neoplasm of unspecified breast: Secondary | ICD-10-CM

## 2017-12-26 ENCOUNTER — Other Ambulatory Visit: Payer: Self-pay | Admitting: Family Medicine

## 2017-12-26 DIAGNOSIS — D249 Benign neoplasm of unspecified breast: Secondary | ICD-10-CM

## 2018-06-15 ENCOUNTER — Other Ambulatory Visit: Payer: BLUE CROSS/BLUE SHIELD

## 2019-05-24 IMAGING — US US BREAST*R* LIMITED INC AXILLA
1 series · 5 of 5 positions shown · non-contrast
Comparison: Previous exam(s).

CLINICAL DATA: Right breast 5 o'clock area of palpable concern felt
by the patient.

EXAM:
DIGITAL DIAGNOSTIC BILATERAL MAMMOGRAM WITH CAD AND TOMO
ULTRASOUND RIGHT BREAST

[Series 1: us breast*right* limited inc axilla · 0.06mm/px · 5 of 5 slices shown]
[im 1/5]
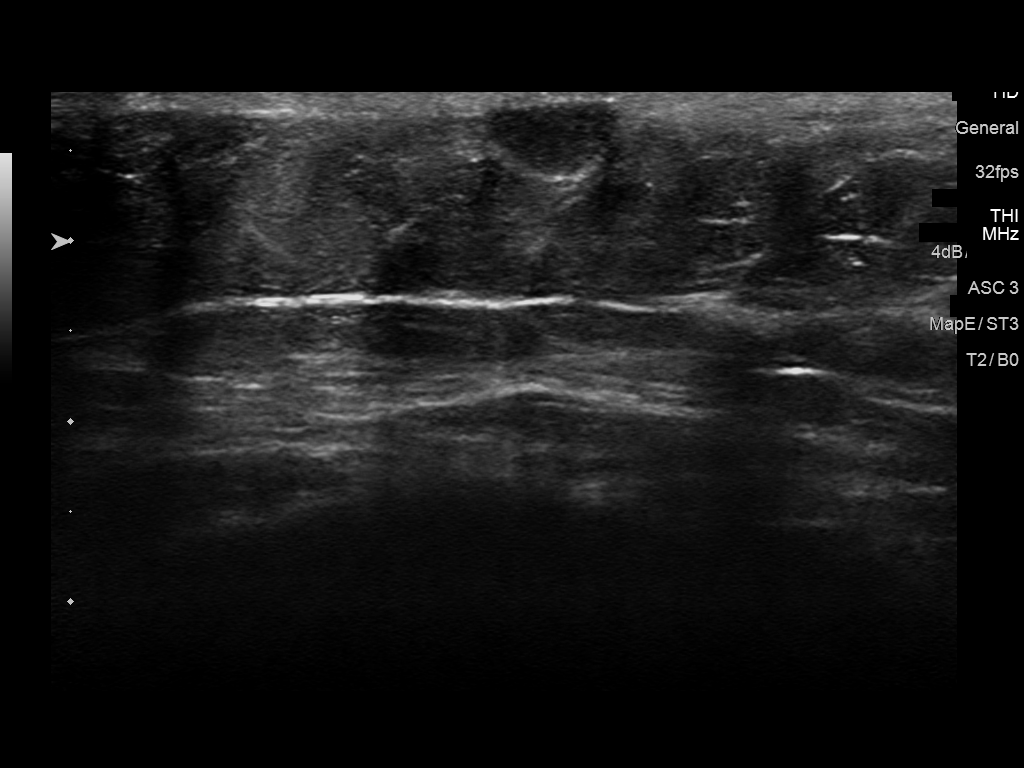
[im 2/5]
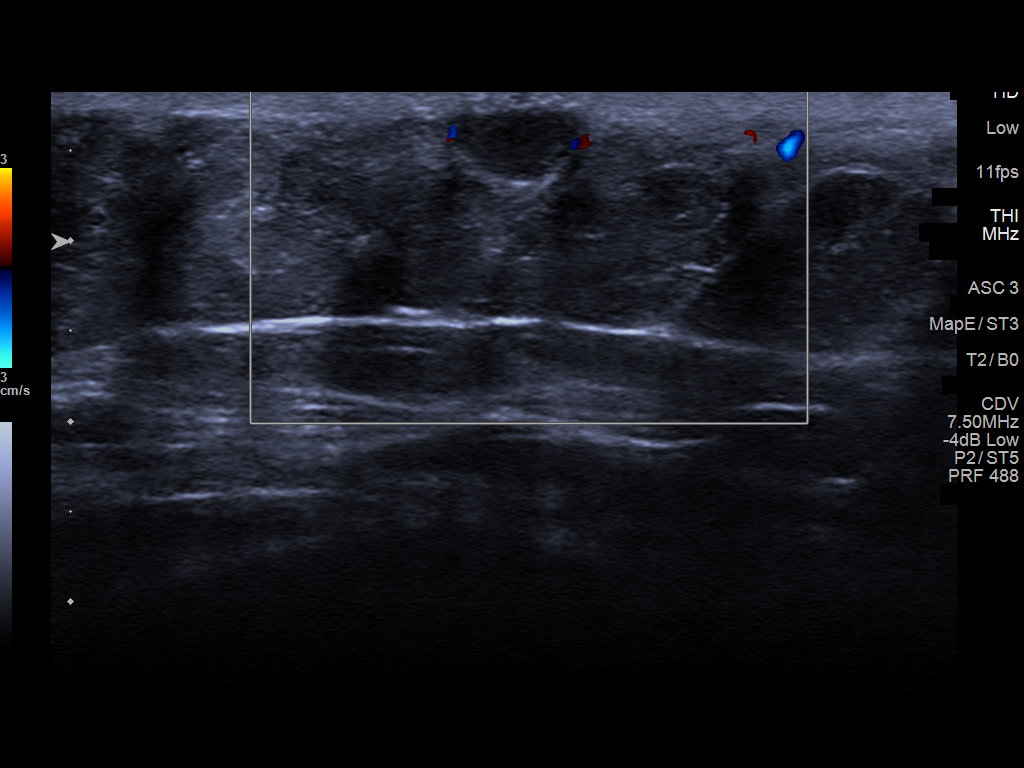
[im 3/5]
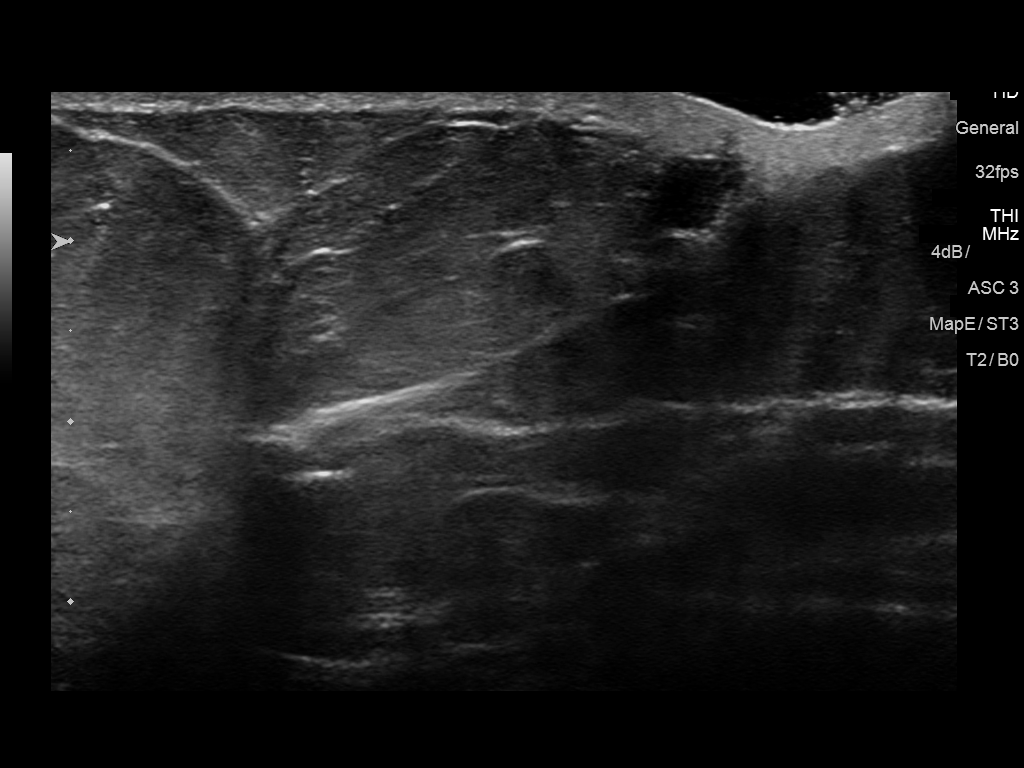
[im 4/5]
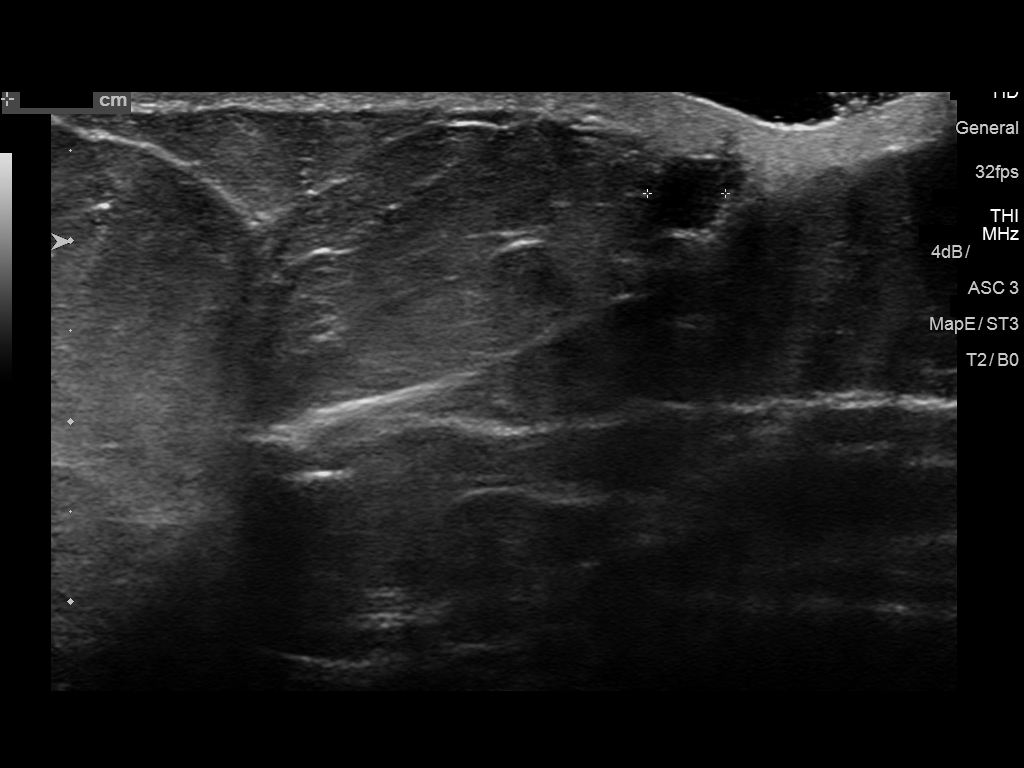
[im 5/5]
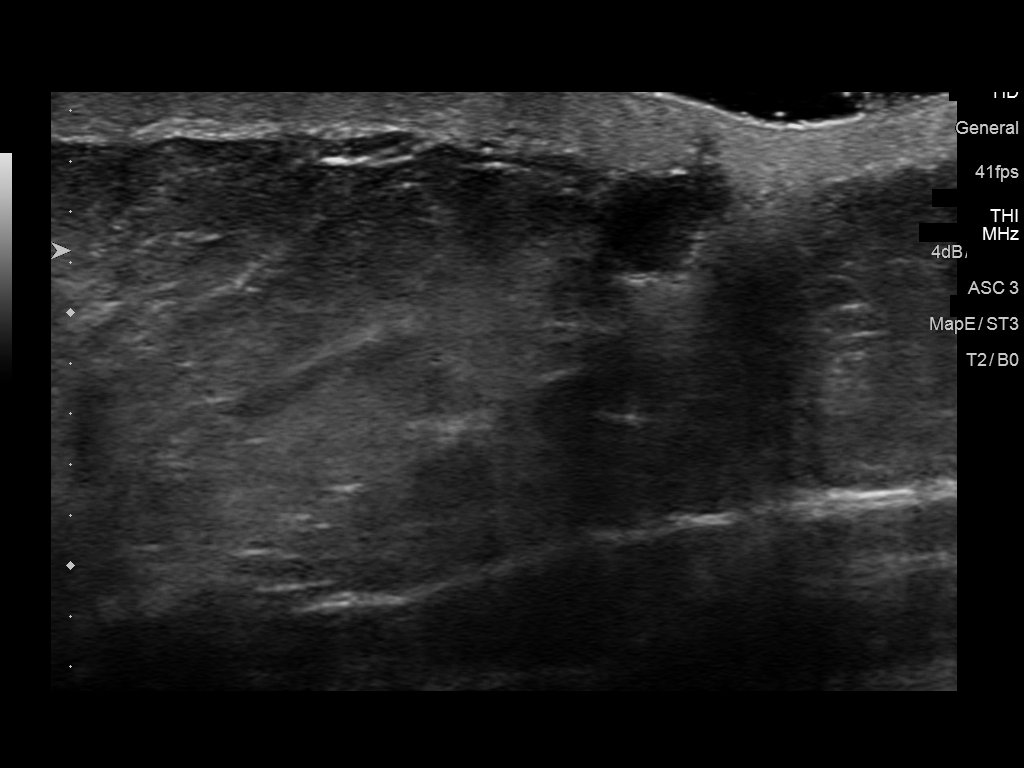

[5 of 5 positions shown; findings below may reference images not displayed]

ACR Breast Density Category b: There are scattered areas of
fibroglandular density.
FINDINGS: Mammographically, there are no suspicious masses, areas of
nonsurgical architectural distortion or microcalcifications in
either breast. There is a 6 mm circumscribed superficial
benign-appearing nodule in the right breast slightly lower outer
quadrant, far posterior depth, which likely corresponds to the area
of palpable concern.

Mammographic images were processed with CAD.

On physical exam, no suspicious masses are palpated.

Targeted ultrasound is performed, showing right breast 6:30 o'clock
10 cm from the nipple benign-appearing hypoechoic circumscribed,
partially embedded within the skin nodule which measures 0.4 x 0.7 x
0.4 mm. This finding likely represents benign sebaceous or epidermal
inclusion cyst, and corresponds to the mammographically seen nodule.
IMPRESSION: Benign-appearing right breast nodule. Patient was advised to seek
dermatology consult as this is a palpable nodule.

If no intervention is performed, then six-month imaging follow-up
with right ultrasound is recommended.

RECOMMENDATION:
Diagnostic ultrasound of the right breast in 6 months.
(Code:[id number])

I have discussed the findings and recommendations with the patient.
Results were also provided in writing at the conclusion of the
visit. If applicable, a reminder letter will be sent to the patient
regarding the next appointment.

BI-RADS CATEGORY  3: Probably benign.

## 2019-10-22 ENCOUNTER — Inpatient Hospital Stay
Admission: RE | Admit: 2019-10-22 | Discharge: 2019-10-22 | Disposition: A | Discharge status: Home / Self Care | Payer: Self-pay | Source: Ambulatory Visit | Attending: *Deleted | Admitting: *Deleted

## 2019-10-22 ENCOUNTER — Other Ambulatory Visit: Payer: Self-pay | Admitting: *Deleted

## 2019-10-22 DIAGNOSIS — Z1231 Encounter for screening mammogram for malignant neoplasm of breast: Secondary | ICD-10-CM

## 2019-11-07 ENCOUNTER — Other Ambulatory Visit: Payer: Self-pay | Admitting: Family Medicine

## 2019-11-07 DIAGNOSIS — N6320 Unspecified lump in the left breast, unspecified quadrant: Secondary | ICD-10-CM

## 2019-11-07 DIAGNOSIS — N644 Mastodynia: Secondary | ICD-10-CM

## 2020-01-01 ENCOUNTER — Ambulatory Visit
Admission: EM | Admit: 2020-01-01 | Discharge: 2020-01-01 | Disposition: A | Discharge status: Home / Self Care | Payer: BLUE CROSS/BLUE SHIELD | Attending: Family Medicine | Admitting: Family Medicine

## 2020-01-01 ENCOUNTER — Other Ambulatory Visit: Payer: Self-pay

## 2020-01-01 DIAGNOSIS — R319 Hematuria, unspecified: Secondary | ICD-10-CM

## 2020-01-01 LAB — URINALYSIS, COMPLETE (UACMP) WITH MICROSCOPIC
Bacteria, UA: NONE SEEN
Bilirubin Urine: NEGATIVE
Glucose, UA: NEGATIVE mg/dL
Ketones, ur: NEGATIVE mg/dL
Leukocytes,Ua: NEGATIVE
Nitrite: NEGATIVE
Protein, ur: NEGATIVE mg/dL
Specific Gravity, Urine: 1.01 (ref 1.005–1.030)
WBC, UA: NONE SEEN WBC/hpf (ref 0–5)
pH: 6 (ref 5.0–8.0)

## 2020-01-01 NOTE — Discharge Instructions (Signed)
It was very nice seeing you today in clinic. Thank you for entrusting me with your care.   Continue to increase your water intake. Monitor your urine for recurrent bleeding. Urine today in clinic was normal.   Make arrangements to follow up with urologist in 1 week for re-evaluation if not improving. I have provided you the name and office contact information for an excellent local provider. If your symptoms/condition worsens, please seek follow up care either here or in the ER. Please remember, our [place] providers are "right here with you" when you need [place].   Again, it was my pleasure to take care of you today. Thank you for choosing our clinic. I hope that you start to feel better quickly.   Honor Loh, MSN, APRN, FNP-C, CEN Advanced Practice Provider [facility]

## 2020-01-01 NOTE — ED Triage Notes (Addendum)
Pt presents with c/o possible hematuria that started yesterday. She states she passed a small clot today. She denies dysuria or other urinary symptoms. She does have some lower groin/back pain that started today. Pt states it is not time for her cycle.

## 2020-01-02 NOTE — ED Provider Notes (Signed)
[place], [place]   Name: Yvette Dyer DOB: [birth year] MRN: [id number] CSN: [id number] PCP: Patient, No Pcp Per  Arrival date and time:  01/01/20 1756  Chief Complaint:  Hematuria  NOTE: Prior to seeing the patient today, I have reviewed the triage nursing documentation and vital signs. Clinical staff has updated patient's PMH/PSHx, current medication list, and drug allergies/intolerances to ensure comprehensive history available to assist in medical decision making.   History:   HPI: Yvette Dyer is a 34 y.o. female who presents today with complaints of intermittent episodes of gross hematuria. Patient first appreciated blood in her urine yesterday x 1 episode. Today, patient reports that she noticed blood again, however this time there was a small clot in the urine. Patient states, "I picked the clot out and rubbed it in between my fingers to make sure it was blood. It was definitely a small blood clot". Patient denies any other associated urinary symptoms; no dysuria, frequency, urgency, back/abdominal pain. She has not experienced any fevers or chills. Patient's last menstrual period was 12/16/2019 (approximate). Patient reports that the day prior to seeing blood in her urine, she has been drinking "an excessive amount... I mean that to say a lot" of ETOH. Additionally, the same day the the gross hematuria was appreciated patient started a new nutritional cleanse (Drink to Shrink) for weight loss purposes. She is unsure what is actually in the cleanse and reports that she purchased it from "some lady". Patient has recently been taking Phentermine that has not been prescribed to her; "I get it from somebody and it helps, but my momma wanted me to say away from it and try this drink". Patient adds that she drinks at least a gallon of water a day. She presents today asking to be checked for a possible urinary tract infection.   Past Medical History:  Diagnosis Date  . Anemia   . Asthma     . Genital herpes     Past Surgical History:  Procedure Laterality Date  . CESAREAN SECTION     2006 and 2008  . CHOLECYSTECTOMY      Family History  Problem Relation Age of Onset  . Asthma Mother   . Sarcoidosis Mother   . Breast cancer Maternal Grandfather 61    Social History   Tobacco Use  . Smoking status: Current Every Day Smoker    Years: 10.00    Types: Cigarettes  . Smokeless tobacco: Never Used  Substance Use Topics  . Alcohol use: Yes    Comment: socially  . Drug use: No    There are no problems to display for this patient.   Home Medications:    Current Meds  Medication Sig  . ferrous sulfate 325 (65 FE) MG EC tablet Take 325 mg by mouth 3 (three) times daily with meals.    Allergies:   Patient has no known allergies.  Review of Systems (ROS):  Review of systems NEGATIVE unless otherwise noted in narrative H&P section.   Vital Signs: Today's Vitals   01/01/20 1812 01/01/20 1818 01/01/20 1854  BP:  140/88   Pulse:  81   Resp:  18   Temp:  98.2 F (36.8 C)   TempSrc:  Oral   SpO2:  99%   Weight: 250 lb (113.4 kg)    Height: 5\' 3"  (1.6 m)    PainSc: 0-No pain  0-No pain    Physical Exam: Physical Exam  Constitutional: She is oriented to person,  place, and time and well-developed, well-nourished, and in no distress.  HENT:  Head: Normocephalic and atraumatic.  Eyes: Pupils are equal, round, and reactive to light.  Cardiovascular: Normal rate, regular rhythm, normal heart sounds and intact distal pulses.  Pulmonary/Chest: Effort normal and breath sounds normal.  Abdominal: Soft. Normal appearance and bowel sounds are normal. She exhibits no distension. There is no abdominal tenderness. There is no CVA tenderness.  Neurological: She is alert and oriented to person, place, and time. Gait normal.  Skin: Skin is warm and dry. No rash noted. She is not diaphoretic.  Psychiatric: Memory, affect and judgment normal. Her mood appears anxious.   Nursing note and vitals reviewed.   Urgent Care Treatments / Results:   Orders Placed This Encounter  Procedures  . Urinalysis, Complete w Microscopic    LABS: PLEASE NOTE: all labs that were ordered this encounter are listed, however only abnormal results are displayed. Labs Reviewed  URINALYSIS, COMPLETE (UACMP) WITH MICROSCOPIC - Abnormal; Notable for the following components:      Result Value   Hgb urine dipstick TRACE (*)    All other components within normal limits    EKG: -None  RADIOLOGY: No results found.  PROCEDURES: Procedures  MEDICATIONS RECEIVED THIS VISIT: Medications - No data to display  PERTINENT CLINICAL COURSE NOTES/UPDATES:   Initial Impression / Assessment and Plan / Urgent Care Course:  Pertinent labs & imaging results that were available during my care of the patient were personally reviewed by me and considered in my medical decision making (see lab/imaging section of note for values and interpretations).  Yvette Dyer is a 34 y.o. female who presents to [facility] today with complaints of Hematuria  Patient is well appearing overall in clinic today. She does not appear to be in any acute distress. Presenting symptoms (see HPI) and exam as documented above. Patient with painless gross hematuria x 2 days. UA today negative for infection; 0 WBC/hpf, 0-5 RBC/hpf, and no nitrites, LE, or bacteria. Discussed that etiology is likely benign in someone her age. Reassurance provided. Potential causes could include excessive ETOH and the new diet cleanse solution that she is using. Will proceed as follows:   Patient encouraged to continue to increase free water intake and to monitor her symptoms for recurrence.    I offered to recheck her urine another day in efforts to put her mind at ease. Patient to monitor and return call to the clinic. If patient calls back, I will ask clinical staff to place an order for a repeat UA only, and allow  patient to drop sample off by the lab. Order can be entered under my name, or if I am not in clinic, they can contact me and I will enter it.    If symptoms persist, patient advised that she could always be seen by urology to discuss further evaluation. I provided her with the name and office contact information fprovided on today's AVS for [place], PA-C. I advised her that this provider is wonderful and would be more than willing to see her to help her determine the cause of her symptoms should they persist. Patient advised the she will need to contact the office to schedule an appointment to be seen.   I have reviewed the follow up and strict return precautions for any new or worsening symptoms. Patient is aware of symptoms that would be deemed urgent/emergent, and would thus require further evaluation either here or in the emergency department.  At the time of discharge, she verbalized understanding and consent with the discharge plan as it was reviewed with her. All questions were fielded by provider and/or clinic staff prior to patient discharge.    Final Clinical Impressions / Urgent Care Diagnoses:   Final diagnoses:  Painless hematuria    New Prescriptions:  [place] Controlled Substance Registry consulted? Not Applicable  No orders of the defined types were placed in this encounter.   Recommended Follow up Care:  Patient encouraged to follow up with the following provider within the specified time frame, or sooner as dictated by the severity of her symptoms. As always, she was instructed that for any urgent/emergent care needs, she should seek care either here or in the emergency department for more immediate evaluation.  Follow-up Information    Zara Council A, PA-C.   Specialties: Urology, Radiology Why: General reassessment of symptoms if not improving Contact information: [place] [id number] [phone]         NOTE: This note was  prepared using Dragon dictation software along with smaller phrase technology. Despite my best ability to proofread, there is the potential that transcriptional errors may still occur from this process, and are completely unintentional.    Karen Kitchens, NP 01/02/20 1122

## 2022-05-10 ENCOUNTER — Other Ambulatory Visit: Payer: Self-pay

## 2022-05-10 DIAGNOSIS — R600 Localized edema: Secondary | ICD-10-CM | POA: Insufficient documentation

## 2022-05-10 DIAGNOSIS — L02211 Cutaneous abscess of abdominal wall: Secondary | ICD-10-CM | POA: Insufficient documentation

## 2022-05-10 DIAGNOSIS — M7989 Other specified soft tissue disorders: Secondary | ICD-10-CM | POA: Diagnosis present

## 2022-05-10 LAB — CBC
HCT: 32.3 % — ABNORMAL LOW (ref 36.0–46.0)
Hemoglobin: 9.5 g/dL — ABNORMAL LOW (ref 12.0–15.0)
MCH: 23.7 pg — ABNORMAL LOW (ref 26.0–34.0)
MCHC: 29.4 g/dL — ABNORMAL LOW (ref 30.0–36.0)
MCV: 80.5 fL (ref 80.0–100.0)
Platelets: 408 10*3/uL — ABNORMAL HIGH (ref 150–400)
RBC: 4.01 MIL/uL (ref 3.87–5.11)
RDW: 18.2 % — ABNORMAL HIGH (ref 11.5–15.5)
WBC: 7.3 10*3/uL (ref 4.0–10.5)
nRBC: 0 % (ref 0.0–0.2)

## 2022-05-10 LAB — BASIC METABOLIC PANEL
Anion gap: 7 (ref 5–15)
BUN: 13 mg/dL (ref 6–20)
CO2: 23 mmol/L (ref 22–32)
Calcium: 8.7 mg/dL — ABNORMAL LOW (ref 8.9–10.3)
Chloride: 110 mmol/L (ref 98–111)
Creatinine, Ser: 0.83 mg/dL (ref 0.44–1.00)
GFR, Estimated: >60 mL/min (ref >60)
Glucose, Bld: 100 mg/dL — ABNORMAL HIGH (ref 70–99)
Potassium: 3.7 mmol/L (ref 3.5–5.1)
Sodium: 140 mmol/L (ref 135–145)

## 2022-05-10 NOTE — ED Triage Notes (Signed)
Pt presents via POV c/o lower extremity swelling x1 month per pt report. Reports swelling in hands and face today. Also report abscess to right side of flank.

## 2022-05-10 NOTE — ED Triage Notes (Signed)
First RN Note: pt c/o swelling to bilateral feet, legs, hands and face. 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 No airway involvement at this time. Pt A&O x4, able to speak in full and complete sentences at this time, ambulatory with steady gait.

## 2022-05-11 ENCOUNTER — Emergency Department: Payer: BC Managed Care – PPO

## 2022-05-11 ENCOUNTER — Emergency Department
Admission: EM | Admit: 2022-05-11 | Discharge: 2022-05-11 | Disposition: A | Discharge status: Home / Self Care | Payer: BC Managed Care – PPO | Attending: Student in an Organized Health Care Education/Training Program | Admitting: Student in an Organized Health Care Education/Training Program

## 2022-05-11 DIAGNOSIS — M7989 Other specified soft tissue disorders: Secondary | ICD-10-CM

## 2022-05-11 DIAGNOSIS — L0291 Cutaneous abscess, unspecified: Secondary | ICD-10-CM

## 2022-05-11 LAB — POC URINE PREG, ED: Preg Test, Ur: NEGATIVE

## 2022-05-11 LAB — BRAIN NATRIURETIC PEPTIDE: B Natriuretic Peptide: 65.2 pg/mL (ref 0.0–100.0)

## 2022-05-11 MED ORDER — FUROSEMIDE 20 MG PO TABS: 20.0000 mg | ORAL_TABLET | Freq: Every day | ORAL | 0 refills | Status: AC

## 2022-05-11 MED ORDER — LIDOCAINE HCL (PF) 1 % IJ SOLN
5.0000 mL | Freq: Once | INTRAMUSCULAR | Status: AC
  Administered 2022-05-11: 5 mL via INTRADERMAL
  Filled 2022-05-11: qty 5

## 2022-05-11 MED ORDER — MUPIROCIN 2 % EX OINT: 1.0000 | TOPICAL_OINTMENT | Freq: Two times a day (BID) | CUTANEOUS | 0 refills | Status: DC

## 2022-05-11 NOTE — ED Provider Notes (Signed)
[facility] Provider Note    Event Date/Time   First MD Initiated Contact with Patient 05/11/22 0019     (approximate)   History   Leg Swelling   HPI  Yvette Dyer is a 36 y.o. female   with a history of asthma mom maternal history of sarcoid presents to the ER for evaluation of lower extremity swelling over the past several weeks after recently started new job where she is on her feet quite a bit.  Also concerned about a recurrent abscess she has on her right flank that has been intermittently draining.  Is causing some irritation to the area not currently on any antibiotics.  No fevers or chills no chest pain or shortness of breath.  Does not think that she is pregnant.      Physical Exam   Triage Vital Signs: ED Triage Vitals  Enc Vitals Group     BP 05/10/22 2259 (!) 156/90     Pulse Rate 05/10/22 2259 97     Resp 05/10/22 2259 16     Temp 05/10/22 2259 99.3 F (37.4 C)     Temp Source 05/10/22 2259 Oral     SpO2 05/10/22 2259 97 %     Weight 05/10/22 2300 230 lb (104.3 kg)     Height 05/10/22 2300 '5\' 2"'$  (1.575 m)     Head Circumference --      Peak Flow --      Pain Score 05/10/22 2300 6     Pain Loc --      Pain Edu? --      Excl. in [place]? --     Most recent vital signs: Vitals:   05/10/22 2259 05/11/22 0024  BP: (!) 156/90 (!) 136/91  Pulse: 97 91  Resp: 16 18  Temp: 99.3 F (37.4 C)   SpO2: 97% 95%     Constitutional: Alert  Eyes: Conjunctivae are normal.  Head: Atraumatic. Nose: No congestion/rhinnorhea. Mouth/Throat: Mucous membranes are moist.   Neck: Painless ROM.  Cardiovascular:   Good peripheral circulation. No m/g/r Respiratory: Normal respiratory effort.  No retractions. No crackles Gastrointestinal: Soft and nontender.  Musculoskeletal:  no deformity. 2+ BLE pitting edema Neurologic:  MAE spontaneously. No gross focal neurologic deficits are appreciated.  Skin:  Skin is warm, dry and intact. Grape size  area of fluctuance and tenderness on right flank just below bra line. No overlying cellulitis Psychiatric: Mood and affect are normal. Speech and behavior are normal.    ED Results / Procedures / Treatments   Labs (all labs ordered are listed, but only abnormal results are displayed) Labs Reviewed  CBC - Abnormal; Notable for the following components:      Result Value   Hemoglobin 9.5 (*)    HCT 32.3 (*)    MCH 23.7 (*)    MCHC 29.4 (*)    RDW 18.2 (*)    Platelets 408 (*)    All other components within normal limits  BASIC METABOLIC PANEL - Abnormal; Notable for the following components:   Glucose, Bld 100 (*)    Calcium 8.7 (*)    All other components within normal limits  BRAIN NATRIURETIC PEPTIDE  POC URINE PREG, ED      RADIOLOGY Please see ED Course for my review and interpretation.  I personally reviewed all radiographic images ordered to evaluate for the above acute complaints and reviewed radiology reports and findings.  These findings were personally discussed with the  patient.  Please see medical record for radiology report.    PROCEDURES:  Critical Care performed: No  ..Incision and Drainage  Date/Time: 05/11/2022 1:34 AM  Performed by: Merlyn Lot, MD Authorized by: Merlyn Lot, MD   Consent:    Consent obtained:  Verbal   Consent given by:  Patient   Risks discussed:  Bleeding, infection, incomplete drainage and pain   Alternatives discussed:  Alternative treatment, delayed treatment and observation Location:    Type:  Abscess   Location:  Trunk   Trunk location:  Abdomen Anesthesia:    Anesthesia method:  Local infiltration   Local anesthetic:  Lidocaine 1% w/o epi Procedure type:    Complexity:  Simple Procedure details:    Incision types:  Stab incision   Incision depth:  Subcutaneous   Drainage:  Purulent   Drainage amount:  Scant Post-procedure details:    Procedure completion:  Tolerated well, no immediate  complications    MEDICATIONS ORDERED IN ED: Medications  lidocaine (PF) (XYLOCAINE) 1 % injection 5 mL (5 mLs Intradermal Given 05/11/22 0037)     IMPRESSION / MDM / [place] / ED COURSE  I reviewed the triage vital signs and the nursing notes.                              Differential diagnosis includes, but is not limited to, edema, DVT, CHF, anasarca, abscess, cellulitis  This patient presented to the ER for evaluation of symptoms as described above.  She is afebrile and nontoxic-appearing x-ray as well as venous ultrasound ordered to rule out DVT and CHF.     Clinical Course as of 05/11/22 0200  Tue May 11, 2022  0129 I&D performed with purulent discharge. [PR]  0159 Patient reassessed remains well-appearing in no acute distress.  She is not pregnant BNP is reassuring.  Does appear stable and appropriate for outpatient follow-up.  Will give short prescription for diuretic recommended compression stockings and close outpatient follow-up with PCP.  Patient agreeable to plan. [PR]    Clinical Course User Index [PR] Merlyn Lot, MD      FINAL CLINICAL IMPRESSION(S) / ED DIAGNOSES   Final diagnoses:  Leg swelling  Abscess     Rx / DC Orders   ED Discharge Orders          Ordered    furosemide (LASIX) 20 MG tablet  Daily        05/11/22 0158    mupirocin ointment (BACTROBAN) 2 %  2 times daily        05/11/22 0158             Note:  This document was prepared using Dragon voice recognition software and may include unintentional dictation errors.    Merlyn Lot, MD 05/11/22 0200

## 2022-05-11 NOTE — ED Notes (Signed)
Pt A&Ox4. Pt has abcess on right side underneath arm. Pt also states she has swelling in lower legs that has been going on for a month. Pt started a new job where she stands on her feet for 12 hours a month ago. Pt said she also had swelling of the face and hands today.

## 2022-10-17 ENCOUNTER — Other Ambulatory Visit: Payer: Self-pay

## 2022-10-17 ENCOUNTER — Emergency Department
Admission: EM | Admit: 2022-10-17 | Discharge: 2022-10-18 | Disposition: A | Discharge status: Home / Self Care | Payer: BC Managed Care – PPO | Attending: Emergency Medicine | Admitting: Emergency Medicine

## 2022-10-17 DIAGNOSIS — M545 Low back pain, unspecified: Secondary | ICD-10-CM | POA: Diagnosis present

## 2022-10-17 DIAGNOSIS — N760 Acute vaginitis: Secondary | ICD-10-CM | POA: Insufficient documentation

## 2022-10-17 DIAGNOSIS — B9689 Other specified bacterial agents as the cause of diseases classified elsewhere: Secondary | ICD-10-CM

## 2022-10-17 DIAGNOSIS — B3731 Acute candidiasis of vulva and vagina: Secondary | ICD-10-CM | POA: Diagnosis not present

## 2022-10-17 LAB — CBC WITH DIFFERENTIAL/PLATELET
Abs Immature Granulocytes: 0.04 10*3/uL (ref 0.00–0.07)
Basophils Absolute: 0 10*3/uL (ref 0.0–0.1)
Basophils Relative: 0 %
Eosinophils Absolute: 0.2 10*3/uL (ref 0.0–0.5)
Eosinophils Relative: 2 %
HCT: 33.4 % — ABNORMAL LOW (ref 36.0–46.0)
Hemoglobin: 9.8 g/dL — ABNORMAL LOW (ref 12.0–15.0)
Immature Granulocytes: 1 %
Lymphocytes Relative: 24 %
Lymphs Abs: 1.9 10*3/uL (ref 0.7–4.0)
MCH: 23.6 pg — ABNORMAL LOW (ref 26.0–34.0)
MCHC: 29.3 g/dL — ABNORMAL LOW (ref 30.0–36.0)
MCV: 80.3 fL (ref 80.0–100.0)
Monocytes Absolute: 0.7 10*3/uL (ref 0.1–1.0)
Monocytes Relative: 8 %
Neutro Abs: 5.2 10*3/uL (ref 1.7–7.7)
Neutrophils Relative %: 65 %
Platelets: 395 10*3/uL (ref 150–400)
RBC: 4.16 MIL/uL (ref 3.87–5.11)
RDW: 21 % — ABNORMAL HIGH (ref 11.5–15.5)
WBC: 7.9 10*3/uL (ref 4.0–10.5)
nRBC: 0 % (ref 0.0–0.2)

## 2022-10-17 LAB — URINALYSIS, ROUTINE W REFLEX MICROSCOPIC
Bilirubin Urine: NEGATIVE
Glucose, UA: NEGATIVE mg/dL
Hgb urine dipstick: NEGATIVE
Ketones, ur: NEGATIVE mg/dL
Leukocytes,Ua: NEGATIVE
Nitrite: NEGATIVE
Protein, ur: NEGATIVE mg/dL
Specific Gravity, Urine: 1.032 — ABNORMAL HIGH (ref 1.005–1.030)
pH: 5 (ref 5.0–8.0)

## 2022-10-17 LAB — WET PREP, GENITAL
Sperm: NONE SEEN
Trich, Wet Prep: NONE SEEN
WBC, Wet Prep HPF POC: <10 (ref <10)

## 2022-10-17 NOTE — ED Triage Notes (Signed)
Lower back pain and pelvic pain since yesterday. Pt reports STD check in December and dx with BV and given abx. Pt seen again at Dr. This past week for follow up and told that she possibly has lypoma in groin. Reports concern for UTI but denies urinary symptoms. Pt alert and oriented following commands. Breathing unlabored speaking in full sentences with symmetric chest rise and fall. Pt ambulatory in triage.

## 2022-10-17 NOTE — ED Provider Notes (Signed)
[facility] Provider Note    Event Date/Time   First MD Initiated Contact with Patient 10/17/22 2243     (approximate)   History   Chief Complaint Back Pain and Pelvic Pain   HPI Yvette Dyer is a 37 y.o. female, history of asthma, anemia, presents to the emergency department for evaluation of pelvic pain, low back pain, and "mass".  She states that yesterday she began experiencing bilateral lower back pain and pelvic pain.  Reports that she was recently diagnosed with bacterial vaginosis back in December and is unsure if this is related.  She had 1 episode of burning sensation with urination, otherwise no significant urinary symptoms.  Denies vaginal bleeding or discharge.  She was recently tested for STDs within the past few weeks and was told that she did not have any.  Lastly, she states that she has noticed a "mass" in her left inguinal region for the past month.  She states that sometimes it enlarges and other times that it decreases.  She says that she will be seeing a general surgeon at some point in the next few weeks for biopsy.  She is unsure if this is related to her symptoms.  Denies chest pain, shortness of breath, nausea/vomiting, diarrhea, hematuria, hematemesis, medic easier, rash/lesions, dizziness/lightheadedness, or weakness.  History Limitations: No limitations.        Physical Exam  Triage Vital Signs: ED Triage Vitals  Enc Vitals Group     BP 10/17/22 2230 (!) 158/92     Pulse Rate 10/17/22 2230 82     Resp 10/17/22 2230 18     Temp 10/17/22 2230 98.2 F (36.8 C)     Temp Source 10/17/22 2230 Oral     SpO2 10/17/22 2230 98 %     Weight 10/17/22 2230 230 lb (104.3 kg)     Height 10/17/22 2230 '5\' 2"'$  (1.575 m)     Head Circumference --      Peak Flow --      Pain Score 10/17/22 2228 8     Pain Loc --      Pain Edu? --      Excl. in [place]? --     Most recent vital signs: Vitals:   10/17/22 2230  BP: (!) 158/92  Pulse: 82   Resp: 18  Temp: 98.2 F (36.8 C)  SpO2: 98%    General: Awake, NAD.  Skin: Warm, dry. No rashes or lesions.  Eyes: PERRL. Conjunctivae normal.  CV: Good peripheral perfusion.  Resp: Normal effort.  Abd: Soft, non-tender. No distention.  Neuro: At baseline. No gross neurological deficits.  Musculoskeletal: Normal ROM of all extremities.  Focused Exam: Pelvic exam shows white discharge in the vaginal canal.  There does not appear to be any erythema or mucopurulent discharge from the cervix.  Nonfriable.  No cervical motion tenderness.  The "mass" in question appears to be inguinal lymphadenopathy on the left side.  No surrounding erythema, warmth, or tenderness.  Only identified with palpation.  Physical Exam    ED Results / Procedures / Treatments  Labs (all labs ordered are listed, but only abnormal results are displayed) Labs Reviewed  WET PREP, GENITAL - Abnormal; Notable for the following components:      Result Value   Yeast Wet Prep HPF POC PRESENT (*)    Clue Cells Wet Prep HPF POC PRESENT (*)    All other components within normal limits  URINALYSIS, ROUTINE W REFLEX MICROSCOPIC -  Abnormal; Notable for the following components:   Color, Urine YELLOW (*)    APPearance HAZY (*)    Specific Gravity, Urine 1.032 (*)    All other components within normal limits  CHLAMYDIA/NGC RT PCR ([facility] ONLY)            CBC WITH DIFFERENTIAL/PLATELET  COMPREHENSIVE METABOLIC PANEL  PREGNANCY, URINE     EKG N/A.    RADIOLOGY  ED Provider Interpretation: ***  No results found.  PROCEDURES:  Critical Care performed: N/A.  Procedures    MEDICATIONS ORDERED IN ED: Medications - No data to display   IMPRESSION / MDM / [place] / ED COURSE  I reviewed the triage vital signs and the nursing notes.                              Differential diagnosis includes, but is not limited to, ovarian cyst, vaginal candidiasis, bacterial vaginosis, chlamydia,  gonorrhea, pelvic inflammatory disease, cystitis, pyelonephritis, ureterolithiasis.  ED Course Patient appears well, vitals within normal limits.  NAD.    Assessment/Plan ***  Considered admission for this patient, but ***  ***Provided the patient with anticipatory guidance, return precautions, and educational material. Encouraged the patient to return to the emergency department at any time if they begin to experience any new or worsening symptoms. Patient expressed understanding and agreed with the plan.   Patient's presentation is most consistent with {EM COPA:27473}       FINAL CLINICAL IMPRESSION(S) / ED DIAGNOSES   Final diagnoses:  None     Rx / DC Orders   ED Discharge Orders     None        Note:  This document was prepared using Dragon voice recognition software and may include unintentional dictation errors.

## 2022-10-18 ENCOUNTER — Emergency Department: Payer: BC Managed Care – PPO

## 2022-10-18 LAB — COMPREHENSIVE METABOLIC PANEL
ALT: 14 U/L (ref 0–44)
AST: 18 U/L (ref 15–41)
Albumin: 3.4 g/dL — ABNORMAL LOW (ref 3.5–5.0)
Alkaline Phosphatase: 78 U/L (ref 38–126)
Anion gap: 9 (ref 5–15)
BUN: 15 mg/dL (ref 6–20)
CO2: 22 mmol/L (ref 22–32)
Calcium: 8.3 mg/dL — ABNORMAL LOW (ref 8.9–10.3)
Chloride: 105 mmol/L (ref 98–111)
Creatinine, Ser: 0.71 mg/dL (ref 0.44–1.00)
GFR, Estimated: >60 mL/min (ref >60)
Glucose, Bld: 98 mg/dL (ref 70–99)
Potassium: 3.7 mmol/L (ref 3.5–5.1)
Sodium: 136 mmol/L (ref 135–145)
Total Bilirubin: 0.5 mg/dL (ref 0.3–1.2)
Total Protein: 7.1 g/dL (ref 6.5–8.1)

## 2022-10-18 LAB — CHLAMYDIA/NGC RT PCR (ARMC ONLY)
Chlamydia Tr: NOT DETECTED
N gonorrhoeae: NOT DETECTED

## 2022-10-18 LAB — LIPASE, BLOOD: Lipase: 37 U/L (ref 11–51)

## 2022-10-18 LAB — PREGNANCY, URINE: Preg Test, Ur: NEGATIVE

## 2022-10-18 MED ORDER — LIDOCAINE 5 % EX PTCH: 1.0000 | MEDICATED_PATCH | Freq: Two times a day (BID) | CUTANEOUS | 0 refills | Status: DC

## 2022-10-18 MED ORDER — LIDOCAINE 5 % EX PTCH
1.0000 | MEDICATED_PATCH | Freq: Once | CUTANEOUS | Status: DC
  Administered 2022-10-18: 1 via TRANSDERMAL
  Filled 2022-10-18: qty 1

## 2022-10-18 MED ORDER — ACETAMINOPHEN 500 MG PO TABS
1000.0000 mg | ORAL_TABLET | Freq: Once | ORAL | Status: AC
  Administered 2022-10-18: 1000 mg via ORAL
  Filled 2022-10-18: qty 2

## 2022-10-18 MED ORDER — METRONIDAZOLE 500 MG PO TABS: 500.0000 mg | ORAL_TABLET | Freq: Two times a day (BID) | ORAL | 0 refills | Status: AC

## 2022-10-18 MED ORDER — IOHEXOL 300 MG/ML  SOLN
100.0000 mL | Freq: Once | INTRAMUSCULAR | Status: AC | PRN
  Administered 2022-10-18: 100 mL via INTRAVENOUS

## 2022-10-18 MED ORDER — FLUCONAZOLE 150 MG PO TABS: 150.0000 mg | ORAL_TABLET | Freq: Once | ORAL | 0 refills | Status: DC

## 2022-10-18 MED ORDER — LIDOCAINE 5 % EX PTCH: 1.0000 | MEDICATED_PATCH | Freq: Two times a day (BID) | CUTANEOUS | 0 refills | Status: AC

## 2022-10-18 MED ORDER — KETOROLAC TROMETHAMINE 15 MG/ML IJ SOLN
15.0000 mg | Freq: Once | INTRAMUSCULAR | Status: AC
  Administered 2022-10-18: 15 mg via INTRAVENOUS
  Filled 2022-10-18: qty 1

## 2022-10-18 MED ORDER — METRONIDAZOLE 500 MG PO TABS: 500.0000 mg | ORAL_TABLET | Freq: Two times a day (BID) | ORAL | 0 refills | Status: DC

## 2022-10-18 MED ORDER — FLUCONAZOLE 150 MG PO TABS: 150.0000 mg | ORAL_TABLET | Freq: Once | ORAL | 0 refills | Status: AC

## 2022-10-18 NOTE — Discharge Instructions (Addendum)
-  You tested positive for both bacterial vaginosis and vaginal yeast infection.  Please take the fluconazole and metronidazole as prescribed.  Please follow-up with your primary care provider or OB/GYN as needed.  -Return to the emergency department anytime if you begin to experience any new or worsening symptoms.  - you can take tylenol 1 g every 8 hours. Ibuprofen 600 every 6-8 hours with food.

## 2022-10-18 NOTE — ED Provider Notes (Signed)
Patient reports pain in her left lower quadrant.  However ultrasound did not show the ovary.  I reevaluated patient and we discussed pros and cons of CT and she would like to proceed.  Patient given some Tylenol, Toradol, lidocaine patch.  Her pain is in the left low back, left low abdomen.  CT ordered evaluate for any diverticulitis, kidney stone, ovarian pathology.  CT scan was reassuring I did call the radiologist to confirm that the left ovary did appear normal.  Given this I have low suspicion for ovarian torsion.  Patient is getting treatment for BV, yeast infection.  Patient felt comfortable with discharge home on repeat evaluation.   Vanessa Manchester, MD 10/18/22 [phone]

## 2022-10-18 NOTE — ED Notes (Signed)
Pt to CT,.

## 2022-10-29 ENCOUNTER — Ambulatory Visit (INDEPENDENT_AMBULATORY_CARE_PROVIDER_SITE_OTHER): Payer: BC Managed Care – PPO | Admitting: Surgery

## 2022-10-29 ENCOUNTER — Encounter: Payer: Self-pay | Admitting: Surgery

## 2022-10-29 ENCOUNTER — Other Ambulatory Visit: Payer: Self-pay

## 2022-10-29 VITALS — BP 124/86 | HR 86 | Temp 98.2°F | Ht 62.0 in | Wt 227.0 lb

## 2022-10-29 DIAGNOSIS — L723 Sebaceous cyst: Secondary | ICD-10-CM | POA: Diagnosis not present

## 2022-10-29 NOTE — Progress Notes (Signed)
10/29/2022  Reason for Visit: Left groin lipoma  Requesting Provider:  Freddy Finner, NP  History of Present Illness: Yvette Dyer is a 37 y.o. female presenting for evaluation of a possible lipoma of the left groin.  The patient report that she's noticed this for a few months now and reports that this area will get bigger at times and more tender, and then will decrease in size and ease the pain.  Denies any drainage from the area and denies any changes to her skin.  She was referred for a possible left groin lipoma.  Currently, denies any flare up and reports that the area is small to palpation and without pain.  However, she would like to address it before it gets worse again.  Past Medical History: Past Medical History:  Diagnosis Date   Anemia    Asthma    Genital herpes      Past Surgical History: Past Surgical History:  Procedure Laterality Date   CESAREAN SECTION     2006 and 2008   CHOLECYSTECTOMY     TUBAL LIGATION      Home Medications: Prior to Admission medications   Medication Sig Start Date End Date Taking? Authorizing Provider  acyclovir (ZOVIRAX) 400 MG tablet Take 400 mg by mouth 3 (three) times daily.   Yes [provider]  albuterol (VENTOLIN HFA) 108 (90 Base) MCG/ACT inhaler Inhale into the lungs.   Yes [provider]  EFFEXOR XR 37.5 MG 24 hr capsule Take 37.5 mg by mouth daily. 09/17/22  Yes [provider]  FEROSUL 325 (65 Fe) MG tablet Take by mouth. 10/13/22  Yes [provider]  ferrous sulfate 325 (65 FE) MG EC tablet Take 325 mg by mouth 3 (three) times daily with meals.   Yes [provider]  fluconazole (DIFLUCAN) 150 MG tablet Take 150 mg by mouth every 3 (three) days. 10/18/22  Yes [provider]  furosemide (LASIX) 20 MG tablet Take 1 tablet (20 mg total) by mouth daily. 05/11/22 05/11/23 Yes Merlyn Lot, MD    Allergies: No Known Allergies  Social History:  reports that she has  been smoking cigarettes. She has a 2.50 pack-year smoking history. She has never used smokeless tobacco. She reports current alcohol use. She reports that she does not use drugs.   Family History: Family History  Problem Relation Age of Onset   Asthma Mother    Sarcoidosis Mother    Breast cancer Maternal Grandfather 10    Review of Systems: Review of Systems  Constitutional:  Negative for chills and fever.  Respiratory:  Negative for shortness of breath.   Cardiovascular:  Negative for chest pain.  Gastrointestinal:  Negative for abdominal pain, nausea and vomiting.  Skin:        Left lower groin cyst    Physical Exam BP 124/86   Pulse 86   Temp 98.2 F (36.8 C) (Oral)   Ht '5\' 2"'$  (1.575 m)   Wt 227 lb (103 kg)   SpO2 97%   BMI 41.52 kg/m  CONSTITUTIONAL: No acute distress, well-nourished HEENT:  Normocephalic, atraumatic, extraocular motion intact. RESPIRATORY:  Normal respiratory effort without pathologic use of accessory muscles. CARDIOVASCULAR: Regular rhythm and rate. MUSCULOSKELETAL:  Normal muscle strength and tone in all four extremities.  No peripheral edema or cyanosis. SKIN: The patient has a small area of firmness in the upper inner left thigh measuring about 1 cm consistent with prior cyst.  There are some firmness likely  associated with flareup related scarring.  No fluctuance or tenderness at this point. NEUROLOGIC:  Motor and sensation is grossly normal.  Cranial nerves are grossly intact. PSYCH:  Alert and oriented to person, place and time. Affect is normal.  Laboratory Analysis: Labs from 10/17/2022: Sodium 136, potassium 3.7, chloride 105, CO2 22, BUN 15, creatinine 0.71.  Total bilirubin 0.5, AST 18, ALT 14, albumin 3.4, alkaline phosphatase 78.  WBC 7.9, hemoglobin 9.8, hematocrit 33.4, platelets 395.  Imaging: CT abdomen/pelvis on 10/18/2022: IMPRESSION: Diffuse bladder wall thickening may be due to underdistention or cystitis. Recommend correlation  with urinalysis.  Assessment and Plan: This is a 37 y.o. female with a left upper inner thigh cyst.  --Discussed with the patient that the history of increase and decrease in size and flare ups of pain would be more consistent with an infected cyst rather than a lipoma.  However, the treatment is very similar in the form of excision.  Discussed with her that I could offer her an office excision under local anesthesia vs excision in the OR with higher level of anesthesia.  She would prefer to have this done in office, which I think is very reasonable given the size and location. --Will schedule her for office excision.  Reviewed risks of bleeding, infection, injury to surrounding structures, and she's willing to proceed.  I spent 30 minutes dedicated to the care of this patient on the date of this encounter to include pre-visit review of records, face-to-face time with the patient discussing diagnosis and management, and any post-visit coordination of care.   Melvyn Neth, [facility]

## 2022-10-29 NOTE — Patient Instructions (Signed)
If you have any concerns or questions, please feel free to call our office.    Epidermoid Cyst  An epidermoid cyst, also called an epidermal cyst, is a small lump under your skin. The cyst contains a substance called keratin. Do not try to pop or open the cyst yourself. What are the causes? A blocked hair follicle. A hair that curls and re-enters the skin instead of growing straight out of the skin. A blocked pore. Irritated skin. An injury to the skin. Certain conditions that are passed along from parent to child. Human papillomavirus (HPV). This happens rarely when cysts occur on the bottom of the feet. Long-term sun damage to the skin. What increases the risk? Having acne. Being female. Having an injury to the skin. Being past puberty. Having certain conditions caused by genes (genetic disorder) What are the signs or symptoms? These cysts are usually harmless, but they can get infected. Symptoms of infection may include: Redness. Inflammation. Tenderness. Warmth. Fever. A bad-smelling substance that drains from the cyst. Pus that drains from the cyst. How is this treated? In many cases, epidermoid cysts go away on their own without treatment. If a cyst becomes infected, treatment may include: Opening and draining the cyst, done by a doctor. After draining, you may need minor surgery to remove the rest of the cyst. Antibiotic medicine. Shots of medicines (steroids) that help to reduce inflammation. Surgery to remove the cyst. Surgery may be done if the cyst: Becomes large. Bothers you. Has a chance of turning into cancer. Do not try to open a cyst yourself. Follow these instructions at home: Medicines Take over-the-counter and prescription medicines as told by your doctor. If you were prescribed an antibiotic medicine, take it as told by your doctor. Do not stop taking it even if you start to feel better. General instructions Keep the area around your cyst clean and  dry. Wear loose, dry clothing. Avoid touching your cyst. Check your cyst every day for signs of infection. Check for: Redness, swelling, or pain. Fluid or blood. Warmth. Pus or a bad smell. Keep all follow-up visits. How is this prevented? Wear clean, dry, clothing. Avoid wearing tight clothing. Keep your skin clean and dry. Take showers or baths every day. Contact a doctor if: Your cyst has symptoms of infection. Your condition does not improve or gets worse. You have a cyst that looks different from other cysts you have had. You have a fever. Get help right away if: Redness spreads from the cyst into the area close by. Summary An epidermoid cyst is a small lump under your skin. If a cyst becomes infected, treatment may include surgery to open and drain the cyst, or to remove it. Take over-the-counter and prescription medicines only as told by your doctor. Contact a doctor if your condition is not improving or is getting worse. Keep all follow-up visits. This information is not intended to replace advice given to you by your health care provider. Make sure you discuss any questions you have with your health care provider. Document Revised: 01/02/2020 Document Reviewed: 01/02/2020 Elsevier Patient Education  [place].

## 2022-11-12 ENCOUNTER — Ambulatory Visit: Payer: BC Managed Care – PPO | Admitting: Surgery

## 2022-11-26 ENCOUNTER — Encounter: Payer: Self-pay | Admitting: Surgery

## 2022-11-26 ENCOUNTER — Other Ambulatory Visit: Payer: Self-pay | Admitting: Surgery

## 2022-11-26 ENCOUNTER — Ambulatory Visit (INDEPENDENT_AMBULATORY_CARE_PROVIDER_SITE_OTHER): Payer: BC Managed Care – PPO | Admitting: Surgery

## 2022-11-26 VITALS — BP 151/83 | HR 96 | Temp 98.7°F | Ht 62.0 in | Wt 230.8 lb

## 2022-11-26 DIAGNOSIS — L723 Sebaceous cyst: Secondary | ICD-10-CM

## 2022-11-26 NOTE — Patient Instructions (Signed)
If you have any concerns or questions, please feel free to call our office. See follow up appointment below.  Excision of Skin Lesions, Care After The following information offers guidance on how to care for yourself after your procedure. Your health care provider may also give you more specific instructions. If you have problems or questions, contact your health care provider. What can I expect after the procedure? After your procedure, it is common to have: Soreness or mild pain. Some redness and swelling. Follow these instructions at home: Excision site care  Follow instructions from your health care provider about how to take care of your excision site. Make sure you: Wash your hands with soap and water for at least 20 seconds before and after you change your bandage (dressing). If soap and water are not available, use hand sanitizer. Change your dressing as told by your health care provider. Leave stitches (sutures), skin glue, or adhesive strips in place. These skin closures may need to stay in place for 2 weeks or longer. If adhesive strip edges start to loosen and curl up, you may trim the loose edges. Do not remove adhesive strips completely unless your health care provider tells you to do that. Check the excision area every day for signs of infection. Watch for: More redness, swelling, or pain. Fluid or blood. Warmth. Pus or a bad smell. Keep the site clean, dry, and protected for at least 48 hours. For bleeding, apply gentle but firm pressure to the area using a folded towel for 20 minutes. Do not take baths, swim, or use a hot tub until your health care provider approves. Ask your health care provider if you may take showers. You may only be allowed to take sponge baths. General instructions Take over-the-counter and prescription medicines only as told by your health care provider. Follow instructions from your health care provider about how to minimize scarring. Scarring should  lessen over time. Avoid sun exposure until the area has healed. Use sunscreen to protect the area from the sun after it has healed. Avoid high-impact exercise and activities until the sutures are removed or the area heals. Keep all follow-up visits. This is important. Contact a health care provider if: You have more redness, swelling, or pain around your excision site. You have fluid or blood coming from your excision site. Your excision site feels warm to the touch. You have pus or a bad smell coming from your excision site. You have a fever. You have pain that does not improve in 2-3 days after your procedure. Get help right away if: You have bleeding that does not stop with pressure or a dressing. Your wound opens up. Summary Take over-the-counter and prescription medicines only as told by your health care provider. Change your dressing as told by your health care provider. Contact a health care provider if you have redness, swelling, pain, or other signs of infection around your excision site. Keep all follow-up visits. This is important. This information is not intended to replace advice given to you by your health care provider. Make sure you discuss any questions you have with your health care provider. Document Revised: 04/28/2021 Document Reviewed: 04/28/2021 Elsevier Patient Education  [place].

## 2022-11-26 NOTE — Progress Notes (Signed)
  Procedure Date:  11/26/2022  Pre-operative Diagnosis:  Left groin cyst  Post-operative Diagnosis:  Left groin cyst, 6 mm size.  Procedure:  Excision of left groin cyst; layered closure of 2 cm wound.  Surgeon:  Melvyn Neth, MD  Anesthesia:  General endotracheal  Estimated Blood Loss:  5 ml  Specimens:  Left groin cyst  Complications:  None  Indications for Procedure:  This is a 37 y.o. female with diagnosis of a symptomatic left groin cyst.  The patient reports that this waxes and wanes and recently was bigger and swollen, but now it is smaller again.  Denies any current pain, but the patient wishes to have this excised. The risks of bleeding, abscess or infection, injury to surrounding structures, and need for further procedures were all discussed with the patient and she was willing to proceed.  Description of Procedure: The patient was correctly identified at bedside.  The patient was placed supine.  Appropriate time-outs were performed.  The area of concern in the left groin was palpated.  If any cyst remnant, it was very small.  Both the patient and I agreed on the location of the cyst and swelling episodes and this was marked.  The patient's left groin was prepped and draped in usual sterile fashion.  Local anesthetic was infused intradermally.  A 2 cm elliptical incision was made over the marked area, and scalpel was used to dissect down the skin and subcutaneous tissue.  Skin flaps were created sharply, and then the skin with subcutaneous tissue was excised intact.  No discernable palpable mass was noted, but there was also no palpable mass within the wound cavity either.  It was sent off to pathology.  The cavity was then irrigated and hemostasis was assured with manual pressure.  The wound was then closed in two layers using 3-0 Vicryl and 4-0 Monocryl.  The incision was cleaned and sealed with DermaBond.  A 4x4 gauze dressing was applied to help with padding/comfort.  The  patient tolerated the procedure well and all sharps were appropriately disposed of at the end of the case.  --Patient may shower tomorrow. --May take Tylenol or Ibuprofen for pain control. --Activity restrictions discussed with patient. --May wear looser clothing so there's less friction over the wound.  Also advised wearing a gauze dressing to help with padding. --Follow up in 10 days.   Melvyn Neth, [facility]

## 2022-12-09 ENCOUNTER — Telehealth: Payer: Self-pay | Admitting: *Deleted

## 2022-12-09 NOTE — Telephone Encounter (Signed)
Patient was scheduled to come in tomorrow to see Dr Hampton Abbot but she had to r/s for next Friday 12/17/22. She had a left groin cyst excision on 11/26/22. She wants to still come in to be seen next Friday but she would like a call to go over her pathology results

## 2022-12-10 ENCOUNTER — Ambulatory Visit: Payer: BC Managed Care – PPO | Admitting: Surgery

## 2022-12-17 ENCOUNTER — Encounter: Payer: Self-pay | Admitting: Surgery

## 2022-12-17 ENCOUNTER — Ambulatory Visit (INDEPENDENT_AMBULATORY_CARE_PROVIDER_SITE_OTHER): Payer: BC Managed Care – PPO | Admitting: Surgery

## 2022-12-17 VITALS — BP 136/86 | HR 88 | Temp 98.0°F | Ht 62.0 in | Wt 227.0 lb

## 2022-12-17 DIAGNOSIS — Z09 Encounter for follow-up examination after completed treatment for conditions other than malignant neoplasm: Secondary | ICD-10-CM

## 2022-12-17 DIAGNOSIS — D1779 Benign lipomatous neoplasm of other sites: Secondary | ICD-10-CM | POA: Diagnosis not present

## 2022-12-17 NOTE — Progress Notes (Signed)
12/17/2022  HPI: Yvette Dyer is a 37 y.o. female s/p excision of left groin mass on 11/26/2022.  Patient reports today for follow-up.  She has been doing well but reports some discomfort at the groin incision particularly when abducting her left leg.  Denies any troubles with the incision itself.  Pathology results showed a lipoma.  Vital signs: BP 136/86   Pulse 88   Temp 98 F (36.7 C)   Ht '5\' 2"'$  (1.575 m)   Wt 227 lb (103 kg)   LMP 12/12/2022 (Exact Date)   SpO2 98%   BMI 41.52 kg/m    Physical Exam: Constitutional: No acute distress Skin: Left groin incision is healing well and is clean, dry, intact.  Dermabond has peeled off.  No wound dehiscence.  There is discomfort to palpation when abducting the left leg likely from stretching the scar tissue.  No significant tenderness.  Assessment/Plan: This is a 38 y.o. female s/p excision of left groin mass.  - Reviewed with her pathology results showing that this was a lipoma.  No evidence of malignancy or other concerns.  The wound is healing appropriately discussed with her that likely the pain is from the scar tissue that is contracting and when stretching the leg pulls on the scar.  This will continue to improve with time.  Patient to start applying vitamin E lotion or cocoa butter lotion to help with the scar tissue. - Follow-up as needed.   Melvyn Neth, [facility]

## 2022-12-17 NOTE — Patient Instructions (Addendum)
The area is fully closed. The skin may feel tight for a while as it continues to heal.   May rub Vitamin-E oil, cocoa butter, or other emmolient agent in area 2-3 times a day to soften the area.   Wait about 4 weeks from surgery for exercising.     Follow-up with our office as needed.  Please call and ask to speak with a nurse if you develop questions or concerns.

## 2023-08-23 ENCOUNTER — Ambulatory Visit
Admission: RE | Admit: 2023-08-23 | Discharge: 2023-08-23 | Disposition: A | Discharge status: Home / Self Care | Payer: BC Managed Care – PPO | Source: Ambulatory Visit | Attending: Emergency Medicine | Admitting: Emergency Medicine

## 2023-08-23 VITALS — BP 142/78 | HR 98 | Temp 98.1°F | Resp 18 | Wt 201.0 lb

## 2023-08-23 DIAGNOSIS — B9689 Other specified bacterial agents as the cause of diseases classified elsewhere: Secondary | ICD-10-CM | POA: Insufficient documentation

## 2023-08-23 DIAGNOSIS — L02416 Cutaneous abscess of left lower limb: Secondary | ICD-10-CM | POA: Diagnosis not present

## 2023-08-23 DIAGNOSIS — N760 Acute vaginitis: Secondary | ICD-10-CM | POA: Insufficient documentation

## 2023-08-23 LAB — URINALYSIS, W/ REFLEX TO CULTURE (INFECTION SUSPECTED)
Glucose, UA: NEGATIVE mg/dL
Hgb urine dipstick: NEGATIVE
Ketones, ur: 15 mg/dL — AB
Leukocytes,Ua: NEGATIVE
Nitrite: NEGATIVE
Protein, ur: 30 mg/dL — AB
Specific Gravity, Urine: >1.03 — ABNORMAL HIGH (ref 1.005–1.030)
pH: 6 (ref 5.0–8.0)

## 2023-08-23 LAB — WET PREP, GENITAL
Sperm: NONE SEEN
Trich, Wet Prep: NONE SEEN
WBC, Wet Prep HPF POC: <10 — AB (ref <10)
Yeast Wet Prep HPF POC: NONE SEEN

## 2023-08-23 LAB — HIV ANTIBODY (ROUTINE TESTING W REFLEX): HIV Screen 4th Generation wRfx: NONREACTIVE

## 2023-08-23 MED ORDER — DOXYCYCLINE HYCLATE 100 MG PO CAPS: 100.0000 mg | ORAL_CAPSULE | Freq: Two times a day (BID) | ORAL | 0 refills | Status: AC

## 2023-08-23 MED ORDER — METRONIDAZOLE 500 MG PO TABS
500.0000 mg | ORAL_TABLET | Freq: Two times a day (BID) | ORAL | 0 refills | Status: AC
Start: 2023-08-23 — End: ?

## 2023-08-23 NOTE — ED Provider Notes (Signed)
[facility]    CSN: [id number] Arrival date & time: 08/23/23  1759      History   Chief Complaint Chief Complaint  Patient presents with   Abdominal Pain    HPI Yvette Dyer is a 37 y.o. female.   HPI  37 year old female with a past medical history significant for asthma, anemia, genital herpes presents for evaluation of multiple complaints.  She is complaining of left-sided abdominal pain has been going on for last 2 days with associated vaginal discharge which is white and creamy and malodorous which has been present for the past month.  She is also experiencing a recurrent abscess on the inner aspect of her left thigh this been going on for at least a month that occasionally will drain a yellow pus but will not go flat.  She is concerned about possible STIs and is requesting gonorrhea, chlamydia, HIV, and syphilis testing.  She denies any urinary symptoms.  Past Medical History:  Diagnosis Date   Anemia    Asthma    Genital herpes     There are no problems to display for this patient.   Past Surgical History:  Procedure Laterality Date   CESAREAN SECTION     2006 and 2008   CHOLECYSTECTOMY     TUBAL LIGATION      OB History   No obstetric history on file.      Home Medications    Prior to Admission medications   Medication Sig Start Date End Date Taking? Authorizing Provider  doxycycline (VIBRAMYCIN) 100 MG capsule Take 1 capsule (100 mg total) by mouth 2 (two) times daily for 7 days. 08/23/23 08/30/23 Yes Becky Augusta, NP  metroNIDAZOLE (FLAGYL) 500 MG tablet Take 1 tablet (500 mg total) by mouth 2 (two) times daily. 08/23/23  Yes Becky Augusta, NP  acyclovir (ZOVIRAX) 400 MG tablet Take 400 mg by mouth 3 (three) times daily.    [provider]  albuterol (VENTOLIN HFA) 108 (90 Base) MCG/ACT inhaler Inhale into the lungs.    [provider]  EFFEXOR XR 37.5 MG 24 hr capsule Take 37.5 mg by mouth daily. 09/17/22   [provider]  FEROSUL 325 (65 Fe) MG tablet Take by mouth. 10/13/22   [provider]  ferrous sulfate 325 (65 FE) MG EC tablet Take 325 mg by mouth 3 (three) times daily with meals.    [provider]  fluconazole (DIFLUCAN) 150 MG tablet Take 150 mg by mouth every 3 (three) days. 10/18/22   [provider]  furosemide (LASIX) 20 MG tablet Take 1 tablet (20 mg total) by mouth daily. 05/11/22 05/11/23  Willy Eddy, MD    Family History Family History  Problem Relation Age of Onset   Asthma Mother    Sarcoidosis Mother    Breast cancer Maternal Grandfather 53    Social History Social History   Tobacco Use   Smoking status: Every Day    Current packs/day: 0.25    Average packs/day: 0.3 packs/day for 10.0 years (2.5 ttl pk-yrs)    Types: Cigarettes    Passive exposure: Past   Smokeless tobacco: Never  Vaping Use   Vaping status: Never Used  Substance Use Topics   Alcohol use: Yes    Comment: socially   Drug use: No     Allergies   Patient has no known allergies.   Review of Systems Review of Systems  Constitutional:  Negative for fever.  Gastrointestinal:  Positive  for abdominal pain.  Genitourinary:  Positive for vaginal discharge and vaginal pain. Negative for dysuria, frequency, hematuria and urgency.  Skin:  Positive for color change and wound.     Physical Exam Triage Vital Signs ED Triage Vitals [08/23/23 1817]  Encounter Vitals Group     BP      Systolic BP Percentile      Diastolic BP Percentile      Pulse      Resp      Temp      Temp src      SpO2      Weight 201 lb (91.2 kg)     Height      Head Circumference      Peak Flow      Pain Score      Pain Loc      Pain Education      Exclude from Growth Chart    No data found.  Updated Vital Signs BP (!) 142/78 (BP Location: Left Arm)   Pulse 98   Temp 98.1 F (36.7 C)   Resp 18   Wt 201 lb (91.2 kg)   LMP 08/10/2023 (Approximate)   SpO2 100%   BMI 36.76  kg/m   Visual Acuity Right Eye Distance:   Left Eye Distance:   Bilateral Distance:    Right Eye Near:   Left Eye Near:    Bilateral Near:     Physical Exam Vitals and nursing note reviewed. Exam conducted with a chaperone present Feliz Beam, RN).  Constitutional:      Appearance: Normal appearance. She is not ill-appearing.  HENT:     Head: Normocephalic and atraumatic.  Abdominal:     General: Abdomen is flat.     Palpations: Abdomen is soft.     Tenderness: There is no abdominal tenderness.  Skin:    General: Skin is warm and dry.     Capillary Refill: Capillary refill takes less than 2 seconds.     Findings: Lesion present. No erythema.  Neurological:     General: No focal deficit present.     Mental Status: She is alert and oriented to person, place, and time.      UC Treatments / Results  Labs (all labs ordered are listed, but only abnormal results are displayed) Labs Reviewed  WET PREP, GENITAL - Abnormal; Notable for the following components:      Result Value   Clue Cells Wet Prep HPF POC PRESENT (*)    WBC, Wet Prep HPF POC <10 (*)    All other components within normal limits  URINALYSIS, W/ REFLEX TO CULTURE (INFECTION SUSPECTED) - Abnormal; Notable for the following components:   Color, Urine AMBER (*)    APPearance HAZY (*)    Specific Gravity, Urine >1.030 (*)    Bilirubin Urine SMALL (*)    Ketones, ur 15 (*)    Protein, ur 30 (*)    Bacteria, UA FEW (*)    All other components within normal limits  HIV ANTIBODY (ROUTINE TESTING W REFLEX)  RPR  CERVICOVAGINAL ANCILLARY ONLY    EKG   Radiology No results found.  Procedures Procedures (including critical care time)  Medications Ordered in UC Medications - No data to display  Initial Impression / Assessment and Plan / UC Course  I have reviewed the triage vital signs and the nursing notes.  Pertinent labs & imaging results that were available during my care of the patient were  reviewed  by me and considered in my medical decision making (see chart for details).   Patient is a nontoxic-appearing 37 year old female presenting for evaluation of multiple complaints as outlined HPI above.  With Feliz Beam, RN as a chaperone I did visualize the questionable abscess on the inner aspect of the patient's left thigh.  There is a hyperpigmented area that is raised without erythema, induration, fluctuance, or discharge.  This appears to be a possible ingrown hair that has developed some granulation tissue.  She is also complaining of left-sided abdominal pain with vaginal discharge but no urinary symptoms.  She is concerned about possible STIs and is requesting gonorrhea, chlamydia, HIV, and syphilis testing.  I will order all of those.  I will also order a vaginal wet prep to evaluate for the presence of BV or yeast which could be contributing to the patient's abdominal pain and the malodorous vaginal discharge.  Additionally, I will order urinalysis to evaluate for the presence of possible UTI.  Vaginal wet prep is positive for clue cells.  Urinalysis is amber in color with hazy appearance and high specific gravity.  Small bilirubin, 15 ketones, 30 protein present but no leukocyte esterase or nitrates.  Reflex microscopy shows skin contamination with 6-10 squamous epithelials and few bacteria.  I will discharge patient home with a diagnosis of bacterial vaginosis and started on metronidazole 500 mg twice daily for 7 days.  I will also start her on doxycycline 100 mg twice daily for 7 days for treatment of her skin abscess on her inner thigh.  She should also apply warm compresses to the area to see if she get it to come to ahead and drain on its own.  STI panel will be back in the next couple days and she will be contacted by phone if any of her results are positive and offered treatment options.   Final Clinical Impressions(s) / UC Diagnoses   Final diagnoses:  BV (bacterial  vaginosis)  Cutaneous abscess of left lower extremity     Discharge Instructions      Take the Flagyl (metronidazole) 500 mg twice daily for treatment of your bacterial vaginosis.  Avoid alcohol while on the metronidazole as taken together will cause of vomiting.  Bacterial vaginosis is often caused by a imbalance of bacteria in your vaginal vault.  This is sometimes a result of using tampons or hormonal fluctuations during her menstrual cycle.  You if your symptoms are recurrent you can try using a boric acid suppository twice weekly to help maintain the acid-base balance in your vagina vault which could prevent further infection.  You can also try vaginal probiotics to help return normal bacterial balance.   As we discussed, I do believe that the lesion on the inside of your left thigh is actually an ingrown hair that is forming some granulation tissue.  Take the doxycycline 100 mg twice daily with food for 7 days to cover for any potential developing bacterial infection.  You can apply warm compresses to the area deceiving get it to come to ahead and rupture on its own.  If it does not resolve with antibiotics and warm compresses     ED Prescriptions     Medication Sig Dispense Auth. Provider   doxycycline (VIBRAMYCIN) 100 MG capsule Take 1 capsule (100 mg total) by mouth 2 (two) times daily for 7 days. 14 capsule Becky Augusta, NP   metroNIDAZOLE (FLAGYL) 500 MG tablet Take 1 tablet (500 mg total) by mouth 2 (two)  times daily. 14 tablet Becky Augusta, NP      PDMP not reviewed this encounter.   Becky Augusta, NP 08/23/23 1930

## 2023-08-23 NOTE — ED Triage Notes (Signed)
Pt c/o left side abdominal pain x 2 days. Pt thinks the pain is coming from her ovaries. She also has an abscess on her left groin that drains and does not get flat for several weeks. She wants STD testing as well. She has some vaginal discharge x 1 month.

## 2023-08-23 NOTE — Discharge Instructions (Addendum)
Take the Flagyl (metronidazole) 500 mg twice daily for treatment of your bacterial vaginosis.  Avoid alcohol while on the metronidazole as taken together will cause of vomiting.  Bacterial vaginosis is often caused by a imbalance of bacteria in your vaginal vault.  This is sometimes a result of using tampons or hormonal fluctuations during her menstrual cycle.  You if your symptoms are recurrent you can try using a boric acid suppository twice weekly to help maintain the acid-base balance in your vagina vault which could prevent further infection.  You can also try vaginal probiotics to help return normal bacterial balance.   As we discussed, I do believe that the lesion on the inside of your left thigh is actually an ingrown hair that is forming some granulation tissue.  Take the doxycycline 100 mg twice daily with food for 7 days to cover for any potential developing bacterial infection.  You can apply warm compresses to the area deceiving get it to come to ahead and rupture on its own.  If it does not resolve with antibiotics and warm compresses

## 2023-08-24 LAB — CERVICOVAGINAL ANCILLARY ONLY
Chlamydia: NEGATIVE
Comment: NEGATIVE
Comment: NORMAL
Neisseria Gonorrhea: NEGATIVE

## 2023-08-24 LAB — RPR
RPR Ser Ql: REACTIVE — AB
RPR Titer: 1:1 {titer}

## 2023-08-25 LAB — T.PALLIDUM AB, TOTAL: T Pallidum Abs: NONREACTIVE

## 2024-11-13 ENCOUNTER — Ambulatory Visit
Admission: EM | Admit: 2024-11-13 | Discharge: 2024-11-13 | Disposition: A | Discharge status: Home / Self Care | Source: Home / Self Care | Attending: Family Medicine | Admitting: Family Medicine

## 2024-11-13 DIAGNOSIS — R7689 Other specified abnormal immunological findings in serum: Secondary | ICD-10-CM

## 2024-11-13 DIAGNOSIS — Z202 Contact with and (suspected) exposure to infections with a predominantly sexual mode of transmission: Secondary | ICD-10-CM | POA: Diagnosis not present

## 2024-11-13 DIAGNOSIS — N898 Other specified noninflammatory disorders of vagina: Secondary | ICD-10-CM

## 2024-11-13 LAB — HIV ANTIBODY (ROUTINE TESTING W REFLEX): HIV Screen 4th Generation wRfx: NONREACTIVE

## 2024-11-13 NOTE — ED Provider Notes (Signed)
 " [facility]    CSN: [id number] Arrival date & time: 11/13/24  1005      History   Chief Complaint Chief Complaint  Patient presents with   SEXUALLY TRANSMITTED DISEASE     HPI HPI Yvette Dyer is a 39 y.o. Dyer.    Yvette Dyer presents for vaginal discharge, dumpster juice vaginal odor for the past week.  Tried boric acid prior to arrival.  Has not had any antibiotics in last 30 days.   Denies known STI exposure but does want to be checked as she recently had unprotected sex.  Yvette Dyer does typically use condoms regularly. She is  not currently pregnant.  Patient's last menstrual period was 11/04/2024 (approximate).    - Abnormal vaginal discharge: yes - vaginal odor:  yes  - vaginal bleeding: no - Dysuria: no - Hematuria: no - Urinary urgency:no  - Urinary frequency: no  - Fever: no - Abdominal pain: no  - Pelvic pain: no - Rash/Skin lesions/mouth ulcers: no - Nausea: no  - Vomiting: no  - Back Pain: no        Past Medical History:  Diagnosis Date   Anemia    Asthma    Genital herpes     There are no active problems to display for this patient.   Past Surgical History:  Procedure Laterality Date   CESAREAN SECTION     2006 and 2008   CHOLECYSTECTOMY     TUBAL LIGATION      OB History   No obstetric history on file.      Home Medications    Prior to Admission medications  Medication Sig Start Date End Date Taking? Authorizing Provider  Semaglutide,0.25 or 0.5MG /DOS, (OZEMPIC, 0.25 OR 0.5 MG/DOSE,) 2 MG/1.5ML SOPN Inject 1 mg into the skin.   Yes [provider]  acyclovir (ZOVIRAX) 400 MG tablet Take 400 mg by mouth 3 (three) times daily.    [provider]  albuterol (VENTOLIN HFA) 108 (90 Base) MCG/ACT inhaler Inhale into the lungs.    [provider]  EFFEXOR XR 37.5 MG 24 hr capsule Take 37.5 mg by mouth daily. 09/17/22   [provider]  FEROSUL 325 (65 Fe) MG tablet Take  by mouth. 10/13/22   [provider]  ferrous sulfate 325 (65 FE) MG EC tablet Take 325 mg by mouth 3 (three) times daily with meals.    [provider]  fluconazole  (DIFLUCAN ) 150 MG tablet Take 150 mg by mouth every 3 (three) days. 10/18/22   [provider]  furosemide  (LASIX ) 20 MG tablet Take 1 tablet (20 mg total) by mouth daily. 05/11/22 05/11/23  Lang Dover, MD  metroNIDAZOLE  (FLAGYL ) 500 MG tablet Take 1 tablet (500 mg total) by mouth 2 (two) times daily. 08/23/23   Bernardino Ditch, NP    Family History Family History  Problem Relation Age of Onset   Asthma Mother    Sarcoidosis Mother    Breast cancer Maternal Grandfather 60    Social History Social History[1]   Allergies   Patient has no known allergies.   Review of Systems Review of Systems: :negative unless otherwise stated in HPI.      Physical Exam Triage Vital Signs ED Triage Vitals  Encounter Vitals Group     BP 11/13/24 1049 123/81     Girls Systolic BP Percentile --      Girls Diastolic BP Percentile --      Boys Systolic BP Percentile --  Boys Diastolic BP Percentile --      Pulse Rate 11/13/24 1049 68     Resp 11/13/24 1049 16     Temp 11/13/24 1049 (!) 97.5 F (36.4 C)     Temp Source 11/13/24 1049 Oral     SpO2 11/13/24 1049 98 %     Weight 11/13/24 1046 210 lb 0.2 oz (95.3 kg)     Height --      Head Circumference --      Peak Flow --      Pain Score 11/13/24 1048 0     Pain Loc --      Pain Education --      Exclude from Growth Chart --    No data found.  Updated Vital Signs BP 123/81 (BP Location: Right Arm)   Pulse 68   Temp (!) 97.5 F (36.4 C) (Oral)   Resp 16   Wt 95.3 kg   LMP 11/04/2024 (Approximate)   SpO2 98%   BMI 38.41 kg/m   Visual Acuity Right Eye Distance:   Left Eye Distance:   Bilateral Distance:    Right Eye Near:   Left Eye Near:    Bilateral Near:     Physical Exam GEN: well appearing Dyer in no acute distress  CVS:  well perfused  RESP: speaking in full sentences without pause  GU: deferred, patient performed self swab     UC Treatments / Results  Labs (all labs ordered are listed, but only abnormal results are displayed) Labs Reviewed  SYPHILIS: RPR W/REFLEX TO RPR TITER AND TREPONEMAL ANTIBODIES, TRADITIONAL SCREENING AND DIAGNOSIS ALGORITHM  HIV ANTIBODY (ROUTINE TESTING W REFLEX)  CERVICOVAGINAL ANCILLARY ONLY    EKG   Radiology No results found.  Procedures Procedures (including critical care time)  Medications Ordered in UC Medications - No data to display  Initial Impression / Assessment and Plan / UC Course  I have reviewed the triage vital signs and the nursing notes.  Pertinent labs & imaging results that were available during my care of the patient were reviewed by me and considered in my medical decision making (see chart for details).      Patient is a 39 y.o.Yvette Dyer  who presents for vaginal discharge and vaginal odor.  Overall patient is well-appearing and afebrile.  Vital signs stable. Vaginal swab for yeast vaginitis, bacterial vaginitis, trichomonas, gonorrhea and chlamydia obtained.  Patient does not request prophylactic STI treatment. Safe sex handout attached to AVS.  Reminded patient to use condoms with every sexual encounter.   Return precautions including abdominal pain, fever, chills, nausea, or vomiting given. Discussed MDM, treatment plan and plan for follow-up with patient who agrees with plan.         Final Clinical Impressions(s) / UC Diagnoses   Final diagnoses:  Vaginal odor  Vaginal discharge  Possible exposure to STD     Discharge Instructions       Your vaginal swab and STD test results will be available in the next 72 hours. If positive, someone will contact you.  You should see your results in your MyChart account.       ED Prescriptions   None    PDMP not reviewed this encounter.    [1]  Social History Tobacco Use    Smoking status: Every Day    Current packs/day: 0.25    Average packs/day: 0.3 packs/day for 10.0 years (2.5 ttl pk-yrs)    Types: Cigarettes    Passive exposure:  Past   Smokeless tobacco: Never  Vaping Use   Vaping status: Never Used  Substance Use Topics   Alcohol use: Yes    Comment: socially   Drug use: No     Yvette Lambe, DO 11/13/24 1110  "

## 2024-11-13 NOTE — ED Triage Notes (Signed)
 Pt presents to UC for STD testing c/o vaginal odor & discharge x2 days.

## 2024-11-13 NOTE — Discharge Instructions (Addendum)
 Your vaginal swab and STD test results will be available in the next 72 hours. If positive, someone will contact you.  You should see your results in your MyChart account.

## 2024-11-14 ENCOUNTER — Telehealth: Payer: Self-pay

## 2024-11-14 LAB — SYPHILIS: RPR W/REFLEX TO RPR TITER AND TREPONEMAL ANTIBODIES, TRADITIONAL SCREENING AND DIAGNOSIS ALGORITHM
RPR Ser Ql: REACTIVE — AB
RPR Titer: 1:1 {titer}

## 2024-11-15 ENCOUNTER — Ambulatory Visit (HOSPITAL_COMMUNITY): Payer: Self-pay

## 2024-11-15 LAB — CERVICOVAGINAL ANCILLARY ONLY
Bacterial Vaginitis (gardnerella): POSITIVE — AB
Candida Glabrata: NEGATIVE
Candida Vaginitis: NEGATIVE
Chlamydia: NEGATIVE
Comment: NEGATIVE
Comment: NEGATIVE
Comment: NEGATIVE
Comment: NEGATIVE
Comment: NEGATIVE
Comment: NORMAL
Neisseria Gonorrhea: NEGATIVE
Trichomonas: NEGATIVE

## 2024-11-15 LAB — T.PALLIDUM AB, TOTAL: T Pallidum Abs: NONREACTIVE

## 2024-11-15 MED ORDER — METRONIDAZOLE 500 MG PO TABS: 500.0000 mg | ORAL_TABLET | Freq: Two times a day (BID) | ORAL | 0 refills | Status: AC
